# Patient Record
Sex: Female | Born: 1990 | Race: Black or African American | Hispanic: No | Marital: Single | Smoking: Current every day smoker
Health system: Southern US, Community
[De-identification: ages and names within clinical notes are randomized; demographics above are authoritative.]

## PROBLEM LIST (undated history)

## (undated) DIAGNOSIS — Z789 Other specified health status: Secondary | ICD-10-CM

## (undated) DIAGNOSIS — I10 Essential (primary) hypertension: Secondary | ICD-10-CM

## (undated) DIAGNOSIS — K819 Cholecystitis, unspecified: Secondary | ICD-10-CM

## (undated) HISTORY — PX: NO PAST SURGERIES: SHX2092

---

## 2010-11-28 ENCOUNTER — Emergency Department (HOSPITAL_BASED_OUTPATIENT_CLINIC_OR_DEPARTMENT_OTHER): Admission: EM | Admit: 2010-11-28 | Discharge: 2010-08-03 | Payer: Self-pay | Admitting: Emergency Medicine

## 2011-03-07 LAB — URINE MICROSCOPIC-ADD ON

## 2011-03-07 LAB — URINE CULTURE

## 2011-03-07 LAB — URINALYSIS, ROUTINE W REFLEX MICROSCOPIC
Bilirubin Urine: NEGATIVE
Ketones, ur: >80 mg/dL — AB
Nitrite: NEGATIVE
Specific Gravity, Urine: 1.03 (ref 1.005–1.030)
Urobilinogen, UA: 1 mg/dL (ref 0.0–1.0)
pH: 6 (ref 5.0–8.0)

## 2012-08-26 ENCOUNTER — Observation Stay (HOSPITAL_BASED_OUTPATIENT_CLINIC_OR_DEPARTMENT_OTHER)
Admission: EM | Admit: 2012-08-26 | Discharge: 2012-08-28 | Disposition: A | Discharge status: Home / Self Care | Payer: Self-pay | Attending: General Surgery | Admitting: General Surgery

## 2012-08-26 ENCOUNTER — Encounter (HOSPITAL_BASED_OUTPATIENT_CLINIC_OR_DEPARTMENT_OTHER): Payer: Self-pay | Admitting: Emergency Medicine

## 2012-08-26 ENCOUNTER — Emergency Department (HOSPITAL_BASED_OUTPATIENT_CLINIC_OR_DEPARTMENT_OTHER): Payer: Self-pay

## 2012-08-26 DIAGNOSIS — K810 Acute cholecystitis: Secondary | ICD-10-CM | POA: Diagnosis present

## 2012-08-26 DIAGNOSIS — K8010 Calculus of gallbladder with chronic cholecystitis without obstruction: Principal | ICD-10-CM | POA: Insufficient documentation

## 2012-08-26 DIAGNOSIS — K819 Cholecystitis, unspecified: Secondary | ICD-10-CM

## 2012-08-26 HISTORY — DX: Other specified health status: Z78.9

## 2012-08-26 HISTORY — DX: Cholecystitis, unspecified: K81.9

## 2012-08-26 LAB — CBC WITH DIFFERENTIAL/PLATELET
Basophils Absolute: 0 10*3/uL (ref 0.0–0.1)
HCT: 38.6 % (ref 36.0–46.0)
Hemoglobin: 12.5 g/dL (ref 12.0–15.0)
Lymphocytes Relative: 12 % (ref 12–46)
Lymphs Abs: 1.6 10*3/uL (ref 0.7–4.0)
MCV: 77.2 fL — ABNORMAL LOW (ref 78.0–100.0)
Monocytes Absolute: 0.4 10*3/uL (ref 0.1–1.0)
Monocytes Relative: 3 % (ref 3–12)
Neutro Abs: 11.8 10*3/uL — ABNORMAL HIGH (ref 1.7–7.7)
RBC: 5 MIL/uL (ref 3.87–5.11)
RDW: 14.4 % (ref 11.5–15.5)
WBC: 13.8 10*3/uL — ABNORMAL HIGH (ref 4.0–10.5)

## 2012-08-26 LAB — COMPREHENSIVE METABOLIC PANEL
Albumin: 4 g/dL (ref 3.5–5.2)
Alkaline Phosphatase: 69 U/L (ref 39–117)
BUN: 9 mg/dL (ref 6–23)
CO2: 21 mEq/L (ref 19–32)
Chloride: 103 mEq/L (ref 96–112)
GFR calc non Af Amer: >90 mL/min (ref >90)
Potassium: 4.4 mEq/L (ref 3.5–5.1)
Total Bilirubin: 0.2 mg/dL — ABNORMAL LOW (ref 0.3–1.2)

## 2012-08-26 LAB — URINALYSIS, ROUTINE W REFLEX MICROSCOPIC
Bilirubin Urine: NEGATIVE
Glucose, UA: NEGATIVE mg/dL
Specific Gravity, Urine: 1.026 (ref 1.005–1.030)
pH: 7.5 (ref 5.0–8.0)

## 2012-08-26 LAB — PREGNANCY, URINE: Preg Test, Ur: NEGATIVE

## 2012-08-26 LAB — URINE MICROSCOPIC-ADD ON

## 2012-08-26 MED ORDER — HYDROMORPHONE HCL PF 1 MG/ML IJ SOLN
1.0000 mg | Freq: Once | INTRAMUSCULAR | Status: AC
  Administered 2012-08-26: 1 mg via INTRAVENOUS
  Filled 2012-08-26 (×2): qty 1

## 2012-08-26 MED ORDER — ACETAMINOPHEN 325 MG PO TABS: 650.0000 mg | ORAL_TABLET | Freq: Four times a day (QID) | ORAL | Status: DC | PRN

## 2012-08-26 MED ORDER — DOCUSATE SODIUM 100 MG PO CAPS
100.0000 mg | ORAL_CAPSULE | Freq: Two times a day (BID) | ORAL | Status: DC
  Administered 2012-08-27 – 2012-08-28 (×3): 100 mg via ORAL
  Filled 2012-08-26 (×5): qty 1

## 2012-08-26 MED ORDER — MORPHINE SULFATE 2 MG/ML IJ SOLN
2.0000 mg | INTRAMUSCULAR | Status: DC | PRN
  Administered 2012-08-27 (×4): 2 mg via INTRAVENOUS
  Filled 2012-08-26 (×4): qty 1

## 2012-08-26 MED ORDER — DIPHENHYDRAMINE HCL 12.5 MG/5ML PO ELIX: 12.5000 mg | ORAL_SOLUTION | Freq: Four times a day (QID) | ORAL | Status: DC | PRN

## 2012-08-26 MED ORDER — OXYCODONE HCL 5 MG PO TABS: 5.0000 mg | ORAL_TABLET | ORAL | Status: DC | PRN

## 2012-08-26 MED ORDER — DIPHENHYDRAMINE HCL 50 MG/ML IJ SOLN: 12.5000 mg | Freq: Four times a day (QID) | INTRAMUSCULAR | Status: DC | PRN

## 2012-08-26 MED ORDER — ONDANSETRON HCL 4 MG/2ML IJ SOLN: 4.0000 mg | Freq: Three times a day (TID) | INTRAMUSCULAR | Status: DC | PRN

## 2012-08-26 MED ORDER — DEXTROSE 5 % IV SOLN
2.0000 g | Freq: Four times a day (QID) | INTRAVENOUS | Status: DC
  Administered 2012-08-27 (×2): 2 g via INTRAVENOUS
  Filled 2012-08-26 (×3): qty 2

## 2012-08-26 MED ORDER — ONDANSETRON HCL 4 MG/2ML IJ SOLN
4.0000 mg | Freq: Four times a day (QID) | INTRAMUSCULAR | Status: DC | PRN
  Administered 2012-08-27 (×2): 4 mg via INTRAVENOUS
  Filled 2012-08-26 (×2): qty 2

## 2012-08-26 MED ORDER — ONDANSETRON HCL 4 MG/2ML IJ SOLN
4.0000 mg | Freq: Once | INTRAMUSCULAR | Status: AC
  Administered 2012-08-26: 4 mg via INTRAVENOUS

## 2012-08-26 MED ORDER — ACETAMINOPHEN 650 MG RE SUPP: 650.0000 mg | Freq: Four times a day (QID) | RECTAL | Status: DC | PRN

## 2012-08-26 MED ORDER — ONDANSETRON HCL 4 MG/2ML IJ SOLN
INTRAMUSCULAR | Status: AC
  Administered 2012-08-26: 4 mg via INTRAVENOUS
  Filled 2012-08-26: qty 2

## 2012-08-26 MED ORDER — HEPARIN SODIUM (PORCINE) 5000 UNIT/ML IJ SOLN
5000.0000 [IU] | Freq: Three times a day (TID) | INTRAMUSCULAR | Status: DC
  Administered 2012-08-27: 5000 [IU] via SUBCUTANEOUS
  Filled 2012-08-26 (×5): qty 1

## 2012-08-26 MED ORDER — DEXTROSE-NACL 5-0.45 % IV SOLN
INTRAVENOUS | Status: AC
  Administered 2012-08-26: 22:00:00 via INTRAVENOUS

## 2012-08-26 MED ORDER — MORPHINE SULFATE 4 MG/ML IJ SOLN
4.0000 mg | Freq: Once | INTRAMUSCULAR | Status: AC
  Administered 2012-08-26: 4 mg via INTRAVENOUS
  Filled 2012-08-26: qty 1

## 2012-08-26 MED ORDER — SODIUM CHLORIDE 0.9 % IV SOLN
INTRAVENOUS | Status: DC
  Administered 2012-08-27: via INTRAVENOUS

## 2012-08-26 MED ORDER — SODIUM CHLORIDE 0.9 % IV BOLUS (SEPSIS)
1000.0000 mL | Freq: Once | INTRAVENOUS | Status: AC
  Administered 2012-08-26: 1000 mL via INTRAVENOUS

## 2012-08-26 MED ORDER — ONDANSETRON HCL 4 MG/2ML IJ SOLN
4.0000 mg | Freq: Once | INTRAMUSCULAR | Status: AC
  Administered 2012-08-26: 4 mg via INTRAVENOUS
  Filled 2012-08-26 (×2): qty 2

## 2012-08-26 NOTE — ED Notes (Signed)
Onset 11am   abd with vomiting

## 2012-08-27 ENCOUNTER — Observation Stay (HOSPITAL_COMMUNITY): Payer: Self-pay

## 2012-08-27 ENCOUNTER — Encounter (HOSPITAL_COMMUNITY)
Admission: EM | Disposition: A | Discharge status: Home / Self Care | Payer: Self-pay | Source: Home / Self Care | Attending: Emergency Medicine

## 2012-08-27 ENCOUNTER — Encounter (HOSPITAL_COMMUNITY): Payer: Self-pay

## 2012-08-27 ENCOUNTER — Encounter (HOSPITAL_COMMUNITY): Payer: Self-pay | Admitting: Anesthesiology

## 2012-08-27 ENCOUNTER — Observation Stay (HOSPITAL_COMMUNITY): Payer: Self-pay | Admitting: Anesthesiology

## 2012-08-27 DIAGNOSIS — K8010 Calculus of gallbladder with chronic cholecystitis without obstruction: Secondary | ICD-10-CM

## 2012-08-27 HISTORY — PX: CHOLECYSTECTOMY: SHX55

## 2012-08-27 LAB — BASIC METABOLIC PANEL
BUN: 7 mg/dL (ref 6–23)
Chloride: 104 mEq/L (ref 96–112)
Creatinine, Ser: 0.52 mg/dL (ref 0.50–1.10)
GFR calc Af Amer: >90 mL/min (ref >90)
GFR calc non Af Amer: >90 mL/min (ref >90)
Glucose, Bld: 114 mg/dL — ABNORMAL HIGH (ref 70–99)

## 2012-08-27 LAB — CBC
HCT: 34.1 % — ABNORMAL LOW (ref 36.0–46.0)
Hemoglobin: 10.9 g/dL — ABNORMAL LOW (ref 12.0–15.0)
MCH: 24.8 pg — ABNORMAL LOW (ref 26.0–34.0)
MCHC: 32 g/dL (ref 30.0–36.0)
RDW: 14.1 % (ref 11.5–15.5)

## 2012-08-27 SURGERY — LAPAROSCOPIC CHOLECYSTECTOMY
Anesthesia: General | Site: Abdomen | Wound class: Clean Contaminated

## 2012-08-27 MED ORDER — NEOSTIGMINE METHYLSULFATE 1 MG/ML IJ SOLN
INTRAMUSCULAR | Status: DC | PRN
  Administered 2012-08-27: 4 mg via INTRAVENOUS

## 2012-08-27 MED ORDER — HEPARIN SODIUM (PORCINE) 5000 UNIT/ML IJ SOLN
5000.0000 [IU] | Freq: Three times a day (TID) | INTRAMUSCULAR | Status: DC
  Administered 2012-08-27 – 2012-08-28 (×2): 5000 [IU] via SUBCUTANEOUS
  Filled 2012-08-27 (×5): qty 1

## 2012-08-27 MED ORDER — BUPIVACAINE-EPINEPHRINE 0.25% -1:200000 IJ SOLN
INTRAMUSCULAR | Status: AC
  Filled 2012-08-27: qty 1

## 2012-08-27 MED ORDER — VITAMINS A & D EX OINT
TOPICAL_OINTMENT | CUTANEOUS | Status: AC
  Filled 2012-08-27: qty 5

## 2012-08-27 MED ORDER — LACTATED RINGERS IR SOLN
Status: DC | PRN
  Administered 2012-08-27: 2000 mL

## 2012-08-27 MED ORDER — HYDROMORPHONE HCL PF 1 MG/ML IJ SOLN: 0.2500 mg | INTRAMUSCULAR | Status: DC | PRN

## 2012-08-27 MED ORDER — LACTATED RINGERS IV SOLN
INTRAVENOUS | Status: DC | PRN
  Administered 2012-08-27: 09:00:00 via INTRAVENOUS

## 2012-08-27 MED ORDER — DIATRIZOATE MEGLUMINE 30 % UR SOLN
URETHRAL | Status: DC | PRN
  Administered 2012-08-27: 300 mL

## 2012-08-27 MED ORDER — SODIUM CHLORIDE 0.9 % IV SOLN
INTRAVENOUS | Status: DC
  Administered 2012-08-27: 12:00:00 via INTRAVENOUS

## 2012-08-27 MED ORDER — PROPOFOL 10 MG/ML IV BOLUS
INTRAVENOUS | Status: DC | PRN
  Administered 2012-08-27: 200 mg via INTRAVENOUS

## 2012-08-27 MED ORDER — BUPIVACAINE-EPINEPHRINE 0.25% -1:200000 IJ SOLN
INTRAMUSCULAR | Status: DC | PRN
  Administered 2012-08-27: 16 mL

## 2012-08-27 MED ORDER — ROCURONIUM BROMIDE 100 MG/10ML IV SOLN
INTRAVENOUS | Status: DC | PRN
  Administered 2012-08-27: 5 mg via INTRAVENOUS
  Administered 2012-08-27: 30 mg via INTRAVENOUS

## 2012-08-27 MED ORDER — ONDANSETRON HCL 4 MG/2ML IJ SOLN
INTRAMUSCULAR | Status: DC | PRN
  Administered 2012-08-27: 4 mg via INTRAVENOUS

## 2012-08-27 MED ORDER — SUCCINYLCHOLINE CHLORIDE 20 MG/ML IJ SOLN
INTRAMUSCULAR | Status: DC | PRN
  Administered 2012-08-27: 100 mg via INTRAVENOUS

## 2012-08-27 MED ORDER — FENTANYL CITRATE 0.05 MG/ML IJ SOLN
INTRAMUSCULAR | Status: DC | PRN
  Administered 2012-08-27: 100 ug via INTRAVENOUS
  Administered 2012-08-27 (×3): 50 ug via INTRAVENOUS

## 2012-08-27 MED ORDER — KETOROLAC TROMETHAMINE 30 MG/ML IJ SOLN: 15.0000 mg | Freq: Once | INTRAMUSCULAR | Status: DC | PRN

## 2012-08-27 MED ORDER — PROMETHAZINE HCL 25 MG/ML IJ SOLN
12.5000 mg | INTRAMUSCULAR | Status: DC | PRN
  Administered 2012-08-27 (×2): 25 mg via INTRAVENOUS
  Filled 2012-08-27 (×2): qty 1

## 2012-08-27 MED ORDER — DEXAMETHASONE SODIUM PHOSPHATE 10 MG/ML IJ SOLN
INTRAMUSCULAR | Status: DC | PRN
  Administered 2012-08-27: 10 mg via INTRAVENOUS

## 2012-08-27 MED ORDER — LACTATED RINGERS IV SOLN: INTRAVENOUS | Status: DC | PRN

## 2012-08-27 MED ORDER — PROMETHAZINE HCL 25 MG/ML IJ SOLN: 6.2500 mg | INTRAMUSCULAR | Status: DC | PRN

## 2012-08-27 MED ORDER — HEMOSTATIC AGENTS (NO CHARGE) OPTIME
TOPICAL | Status: DC | PRN
  Administered 2012-08-27: 1 via TOPICAL

## 2012-08-27 MED ORDER — GLYCOPYRROLATE 0.2 MG/ML IJ SOLN
INTRAMUSCULAR | Status: DC | PRN
  Administered 2012-08-27: 0.6 mg via INTRAVENOUS

## 2012-08-27 SURGICAL SUPPLY — 41 items
APPLIER CLIP 5 13 M/L LIGAMAX5 (MISCELLANEOUS) ×3
APPLIER CLIP ROT 10 11.4 M/L (STAPLE)
BANDAGE ADHESIVE 1X3 (GAUZE/BANDAGES/DRESSINGS) ×9 IMPLANT
BENZOIN TINCTURE PRP APPL 2/3 (GAUZE/BANDAGES/DRESSINGS) ×3 IMPLANT
CANISTER SUCTION 2500CC (MISCELLANEOUS) ×3 IMPLANT
CHLORAPREP W/TINT 26ML (MISCELLANEOUS) ×3 IMPLANT
CLIP APPLIE 5 13 M/L LIGAMAX5 (MISCELLANEOUS) ×2 IMPLANT
CLIP APPLIE ROT 10 11.4 M/L (STAPLE) IMPLANT
CLOTH BEACON ORANGE TIMEOUT ST (SAFETY) ×3 IMPLANT
COVER MAYO STAND STRL (DRAPES) ×3 IMPLANT
COVER SURGICAL LIGHT HANDLE (MISCELLANEOUS) ×3 IMPLANT
DECANTER SPIKE VIAL GLASS SM (MISCELLANEOUS) ×3 IMPLANT
DRAPE C-ARM 42X72 X-RAY (DRAPES) ×3 IMPLANT
DRAPE LAPAROSCOPIC ABDOMINAL (DRAPES) ×3 IMPLANT
DRAPE UTILITY XL STRL (DRAPES) ×3 IMPLANT
DRSG TEGADERM 2-3/8X2-3/4 SM (GAUZE/BANDAGES/DRESSINGS) ×3 IMPLANT
ELECT REM PT RETURN 9FT ADLT (ELECTROSURGICAL) ×3
ELECTRODE REM PT RTRN 9FT ADLT (ELECTROSURGICAL) ×2 IMPLANT
GLOVE BIO SURGEON STRL SZ7.5 (GLOVE) ×3 IMPLANT
GLOVE BIOGEL M STRL SZ7.5 (GLOVE) ×6 IMPLANT
GLOVE BIOGEL PI IND STRL 7.0 (GLOVE) ×2 IMPLANT
GLOVE BIOGEL PI INDICATOR 7.0 (GLOVE) ×1
GLOVE INDICATOR 8.0 STRL GRN (GLOVE) ×3 IMPLANT
GOWN PREVENTION PLUS XXLARGE (GOWN DISPOSABLE) ×3 IMPLANT
GOWN STRL NON-REIN LRG LVL3 (GOWN DISPOSABLE) ×3 IMPLANT
GOWN STRL REIN XL XLG (GOWN DISPOSABLE) ×6 IMPLANT
HEMOSTAT SNOW SURGICEL 2X4 (HEMOSTASIS) ×3 IMPLANT
KIT BASIN OR (CUSTOM PROCEDURE TRAY) ×3 IMPLANT
NS IRRIG 1000ML POUR BTL (IV SOLUTION) ×3 IMPLANT
POUCH SPECIMEN RETRIEVAL 10MM (ENDOMECHANICALS) ×3 IMPLANT
SET CHOLANGIOGRAPH MIX (MISCELLANEOUS) ×3 IMPLANT
SET IRRIG TUBING LAPAROSCOPIC (IRRIGATION / IRRIGATOR) ×3 IMPLANT
SOLUTION ANTI FOG 6CC (MISCELLANEOUS) ×3 IMPLANT
STRIP CLOSURE SKIN 1/2X4 (GAUZE/BANDAGES/DRESSINGS) ×3 IMPLANT
SUT MNCRL AB 4-0 PS2 18 (SUTURE) ×3 IMPLANT
SUT VICRYL 0 UR6 27IN ABS (SUTURE) ×3 IMPLANT
TOWEL OR 17X26 10 PK STRL BLUE (TOWEL DISPOSABLE) ×3 IMPLANT
TRAY LAP CHOLE (CUSTOM PROCEDURE TRAY) ×3 IMPLANT
TROCAR BLADELESS OPT 5 75 (ENDOMECHANICALS) ×9 IMPLANT
TROCAR XCEL BLUNT TIP 100MML (ENDOMECHANICALS) ×3 IMPLANT
TUBING INSUFFLATION 10FT LAP (TUBING) ×3 IMPLANT

## 2012-08-27 NOTE — Progress Notes (Signed)
Pt returned to room from PACU post Lap. Choley., Lethargic but arousable. Abdominal dsg. x4, 2 sites soiled. Will continue to monitor.

## 2012-08-27 NOTE — Preoperative (Signed)
Beta Blockers   Reason not to administer Beta Blockers:Not Applicable 

## 2012-08-27 NOTE — ED Provider Notes (Signed)
History     CSN: [id number]  Arrival date & time 08/26/12  1738   First MD Initiated Contact with Patient 08/26/12 1806      Chief Complaint  Patient presents with  . Abdominal Pain    (Consider location/radiation/quality/duration/timing/severity/associated sxs/prior treatment) HPI Patient is a 21 year old female who presents today complaining of 10 out of 10 epigastric and right upper quadrant abdominal pain with associated nausea and vomiting. Patient noted that this began today upon waking at 10:30 AM. She at [facility] prior to going to sleep last night. Patient has not been able to tolerate by mouth and his been vomiting repeatedly. She denies any fevers or sick contacts. Patient has history of prior diagnosis of cholecystitis with cholelithiasis during her last pregnancy. At that time patient merely had surgery while she was [redacted] weeks pregnant but after treatment with antibiotic this was able to be avoided. Plan was for patient to have surgery following birth of her child but her symptoms had been dormant until today. She has no history of abdominal surgery. Patient denies radiation of her pain. She describes it as sharp. Patient denies any urinary symptoms, vaginal discharge, her concern that she may be pregnant. There are no other associated or modifying factors. Past Medical History  Diagnosis Date  . Cholecystitis     No past surgical history on file.  No family history on file.  History  Substance Use Topics  . Smoking status: Current Everyday Smoker  . Smokeless tobacco: Not on file  . Alcohol Use: Yes    OB History    Grav Para Term Preterm Abortions TAB SAB Ect Mult Living                  Review of Systems  Constitutional: Positive for appetite change.  HENT: Negative.   Eyes: Negative.   Respiratory: Negative.   Cardiovascular: Negative.   Gastrointestinal: Positive for nausea, vomiting and abdominal pain.  Genitourinary: Negative.   Musculoskeletal:  Negative.   Skin: Negative.   Neurological: Negative.   Hematological: Negative.   Psychiatric/Behavioral: Negative.   All other systems reviewed and are negative.    Allergies  Review of patient's allergies indicates no known allergies.  Home Medications  No current outpatient prescriptions on file.  BP 126/80  Pulse 58  Temp 97.7 F (36.5 C) (Oral)  Resp 18  Ht 5\' 9"  (1.753 m)  Wt 274 lb 4 oz (124.4 kg)  BMI 40.50 kg/m2  SpO2 99%  Physical Exam  Nursing note and vitals reviewed. GEN: Well-developed, well-nourished female in moderate distress, vomiting HEENT: Atraumatic, normocephalic. Oropharynx clear without erythema EYES: PERRLA BL, no scleral icterus. NECK: Trachea midline, no meningismus CV: regular rate and rhythm. No murmurs, rubs, or gallops PULM: No respiratory distress.  No crackles, wheezes, or rales. GI: soft, positive Murphy's sign,. Right upper quadrant and epigastric tenderness to palpation. No peritoneal signs. + bowel sounds  GU: deferred Neuro: cranial nerves grossly 2-12 intact, no abnormalities of strength or sensation, A and O x 3 MSK: Patient moves all 4 extremities symmetrically, no deformity, edema, or injury noted Skin: No rashes petechiae, purpura, or jaundice Psych: no abnormality of mood   ED Course  Procedures (including critical care time)  Labs Reviewed  URINALYSIS, ROUTINE W REFLEX MICROSCOPIC - Abnormal; Notable for the following:    Hgb urine dipstick SMALL (*)     All other components within normal limits  CBC WITH DIFFERENTIAL - Abnormal; Notable for the following:  WBC 13.8 (*)     MCV 77.2 (*)     MCH 25.0 (*)     Neutrophils Relative 85 (*)     Neutro Abs 11.8 (*)     All other components within normal limits  COMPREHENSIVE METABOLIC PANEL - Abnormal; Notable for the following:    Glucose, Bld 107 (*)     Total Bilirubin 0.2 (*)     All other components within normal limits  PREGNANCY, URINE  LIPASE, BLOOD  URINE  MICROSCOPIC-ADD ON  BASIC METABOLIC PANEL  CBC   US Abdomen Complete  08/26/2012  *RADIOLOGY REPORT*  Clinical Data:  Epigastric and right upper quadrant abdominal pain.  COMPLETE ABDOMINAL ULTRASOUND  Comparison:  None.  Findings:  Gallbladder:  Shadowing gallstones at the neck of gallbladder measure up to 18 mm.  The gallstones are mobile.  There is no wall thickening or edematous change.  The maximal wall thickness is 2.2 mm, within normal limits.  There is a positive sonographic Murphy's sign.  Common bile duct:  Normal in caliber. No biliary ductal dilation. The maximal diameter is 2.7 mm.  Liver:  No focal lesion identified.  Within normal limits in parenchymal echogenicity.  IVC:  Limited visualization due to overlying bowel gas.  Pancreas:  Limited visualization due to overlying bowel gas.  Spleen:  Normal size and echotexture without focal parenchymal abnormality.  The maximal length is 6.0 cm, within normal limits.  Right Kidney:  No hydronephrosis.  Well-preserved cortex.  Normal size and parenchymal echotexture without focal abnormalities. The maximal length is 11.0 cm, within normal limits.  Left Kidney:  No hydronephrosis.  Well-preserved cortex.  Normal size and parenchymal echotexture without focal abnormalities. The maximal length is 0.7 cm, within normal limits.  Abdominal aorta:  No aneurysm identified.  IMPRESSION:  1.  Multiple gallstones with a positive sonographic Murphy's sign, raising concern for early cholecystitis.  There is no significant wall thickening. 2.  This poor visualization of the IVC, pancreas, and aorta secondary to overlying bowel gas.   Original Report Authenticated By: Jamesetta Orleans. MATTERN, M.D.      1. Cholecystitis       MDM  Patient was evaluated by myself. Based upon history and presentation patient had workup for her abdominal pain performed. Urine pregnancy was negative and laboratory workup was remarkable for a leukocytosis of 13.8 with no  abnormalities of liver function are lipase. Patient was given IV fluids as well as Zofran and morphine. This improved her symptoms but did not resolve them. Patient abdominal ultrasound with positive sonographic Murphy's sign as well as numerous gallstones in the neck of the gallbladder measuring up to 18 mm. Maximal gallbladder wall thickness is 2.2 mm with common bile duct being normal in caliber. Patient remained afebrile. Immediately following exam patient began vomiting again. She was unable to tolerate by mouth. I discussed the patient with on-call surgeon, Dr. Maisie Fus. She exhibited the patient for direct admit to [facility]. Patient was transferred in good condition.        Cyndra Numbers, MD 08/27/12 [phone]

## 2012-08-27 NOTE — H&P (Signed)
Chief Complaint  Patient presents with  . Abdominal Pain    HISTORY: April Morrison is a 21 y.o. female who presented to [facility] last night with generalized abd pain since 11 am that morning.  This was accompanied by intractable nausea and vomiting.  Upon arrival to the ED, lab work revealed a slightly elevated wbc but no elevated lft's or signs of pancreatitis.  An [place] was performed which was read as early cholecystitis.  She states low grade fevers at home.  No diarrhea or constipation.    Past Medical History  Diagnosis Date  . Cholecystitis   . No pertinent past medical history     Past Surgical History  Procedure Date  . No past surgeries     Current Facility-Administered Medications  Medication Dose Route Frequency Provider Last Rate Last Dose  . 0.9 %  sodium chloride infusion   Intravenous Continuous Romie Levee, MD 100 mL/hr at 08/27/12 0015    . acetaminophen (TYLENOL) tablet 650 mg  650 mg Oral Q6H PRN Romie Levee, MD       Or  . acetaminophen (TYLENOL) suppository 650 mg  650 mg Rectal Q6H PRN Romie Levee, MD      . cefOXitin (MEFOXIN) 2 g in dextrose 5 % 50 mL IVPB  2 g Intravenous Q6H Romie Levee, MD   2 g at 08/27/12 0609  . dextrose 5 %-0.45 % sodium chloride infusion   Intravenous STAT Meagan Hunt, MD      . diphenhydrAMINE (BENADRYL) injection 12.5-25 mg  12.5-25 mg Intravenous Q6H PRN Romie Levee, MD       Or  . diphenhydrAMINE (BENADRYL) 12.5 MG/5ML elixir 12.5-25 mg  12.5-25 mg Oral Q6H PRN Romie Levee, MD      . docusate sodium (COLACE) capsule 100 mg  100 mg Oral BID Romie Levee, MD   100 mg at 08/27/12 0009  . heparin injection 5,000 Units  5,000 Units Subcutaneous Q8H Romie Levee, MD   5,000 Units at 08/27/12 0009  . HYDROmorphone (DILAUDID) injection 1 mg  1 mg Intravenous Once Cyndra Numbers, MD   1 mg at 08/26/12 2051  . morphine 2 MG/ML injection 2 mg  2 mg Intravenous Q2H PRN Romie Levee, MD   2 mg at 08/27/12 0609  . morphine 4  MG/ML injection 4 mg  4 mg Intravenous Once Cyndra Numbers, MD   4 mg at 08/26/12 1824  . ondansetron (ZOFRAN) injection 4 mg  4 mg Intravenous Once Cyndra Numbers, MD   4 mg at 08/26/12 1819  . ondansetron (ZOFRAN) injection 4 mg  4 mg Intravenous Once Cyndra Numbers, MD   4 mg at 08/26/12 2049  . ondansetron (ZOFRAN) injection 4 mg  4 mg Intravenous Q6H PRN Romie Levee, MD   4 mg at 08/27/12 0353  . oxyCODONE (Oxy IR/ROXICODONE) immediate release tablet 5 mg  5 mg Oral Q4H PRN Romie Levee, MD      . promethazine (PHENERGAN) injection 12.5-25 mg  12.5-25 mg Intravenous Q4H PRN Romie Levee, MD   25 mg at 08/27/12 0609  . sodium chloride 0.9 % bolus 1,000 mL  1,000 mL Intravenous Once Cyndra Numbers, MD   1,000 mL at 08/26/12 1822  . vitamin A & D ointment           . DISCONTD: ondansetron (ZOFRAN) injection 4 mg  4 mg Intravenous Q8H PRN Cyndra Numbers, MD         No Known Allergies   History  reviewed. No pertinent family history.   History   Social History  . Marital Status: Single    Spouse Name: N/A    Number of Children: N/A  . Years of Education: N/A   Social History Main Topics  . Smoking status: Current Everyday Smoker  . Smokeless tobacco: None  . Alcohol Use: Yes  . Drug Use: No  . Sexually Active:    Other Topics Concern  . None   Social History Narrative  . None     REVIEW OF SYSTEMS - PERTINENT POSITIVES ONLY: 12 point review of systems negative other than HPI and PMH  EXAM: Filed Vitals:   08/27/12 0528  BP: 123/75  Pulse: 54  Temp: 98.1 F (36.7 C)  Resp: 18    Gen:  No acute distress.  Well nourished and well groomed.   Neurological: Alert and oriented to person, place, and time. Coordination normal.  Head: Normocephalic and atraumatic.  Eyes: Conjunctivae are normal. Pupils are equal, round, and reactive to light. No scleral icterus.  Neck: Normal range of motion. Neck supple. No tracheal deviation or thyromegaly present.  No cervical  lymphadenopathy. Cardiovascular: Normal rate, regular rhythm, normal heart sounds and intact distal pulses.  Respiratory: Effort normal.  No respiratory distress.  Breath sounds normal.  No wheezes, rales or rhonchi.  GI: Soft. Bowel sounds are normal. The abdomen is soft and tender to palpation in RUQ.   Musculoskeletal: Normal range of motion. Extremities are nontender.  Skin: Skin is warm and dry. No rash noted. No diaphoresis. No erythema. No pallor. No clubbing, cyanosis, or edema.   Psychiatric: Normal mood and affect. Behavior is normal. Judgment and thought content normal.    LABORATORY RESULTS: Lab Results  Component Value Date   WBC 14.6* 08/27/2012   HGB 10.9* 08/27/2012   HCT 34.1* 08/27/2012   MCV 77.5* 08/27/2012   PLT 262 08/27/2012   Lab Results  Component Value Date   ALT 11 08/26/2012   AST 17 08/26/2012   ALKPHOS 69 08/26/2012   BILITOT 0.2* 08/26/2012   Lab Results  Component Value Date   LIPASE 18 08/26/2012   Sodium 137  Potassium 3.7  Chloride 104  CO2 23  Glucose Creatinine, Ser 0.52  Calcium 8.6   RADIOLOGY RESULTS:   Images and reports are reviewed. RUQ US  Gallbladder: Shadowing gallstones at the neck of gallbladder measure up to 18 mm. The gallstones are mobile. There is no wall thickening or edematous change. The maximal wall thickness is 2.2 mm, within normal limits. There is a positive sonographic Murphy's sign. IMPRESSION:  1. Multiple gallstones with a positive sonographic Murphy's sign, raising concern for early cholecystitis. There is no significant wall thickening.  ASSESSMENT AND PLAN:  Acute cholecystitis with no signs of pancreatitis or CBD obstruction IV Abx- Cefoxitin  OR today for l/s cholecystectomy   Vanita Panda, MD Colon and Rectal Surgery / General Surgery [facility]      Visit Diagnoses: 1. Cholecystitis     Primary Care Physician: No primary provider on file.

## 2012-08-27 NOTE — Transfer of Care (Signed)
Immediate Anesthesia Transfer of Care Note  Patient: April Morrison  Procedure(s) Performed: Procedure(s) (LRB) with comments: LAPAROSCOPIC CHOLECYSTECTOMY () - attempted cholangiogram  Patient Location: PACU  Anesthesia Type: General  Level of Consciousness: awake, alert  and patient cooperative  Airway & Oxygen Therapy: Patient Spontanous Breathing and Patient connected to face mask oxygen  Post-op Assessment: Report given to PACU RN and Post -op Vital signs reviewed and stable  Post vital signs: Reviewed and stable  Complications: No apparent anesthesia complications

## 2012-08-27 NOTE — Anesthesia Postprocedure Evaluation (Signed)
  Anesthesia Post-op Note  Patient: April Morrison  Procedure(s) Performed: Procedure(s) (LRB): LAPAROSCOPIC CHOLECYSTECTOMY ()  Patient Location: PACU  Anesthesia Type: General  Level of Consciousness: awake and alert   Airway and Oxygen Therapy: Patient Spontanous Breathing  Post-op Pain: mild  Post-op Assessment: Post-op Vital signs reviewed, Patient's Cardiovascular Status Stable, Respiratory Function Stable, Patent Airway and No signs of Nausea or vomiting  Post-op Vital Signs: stable  Complications: No apparent anesthesia complications

## 2012-08-27 NOTE — Op Note (Signed)
The patient was identified & brought into the operating room. The patient was positioned supine with arms out to the side. SCDs were active during the entire case. The patient underwent general anesthesia without any difficulty.  The abdomen was prepped and draped in a sterile fashion. A Surgical Timeout confirmed our plan.  We positioned the patient in reverse [place] & right side up. A 10mm hassan laparoscopic port was placed through the abdominal wall using a cut down technique.  Entry was clean.  We induced carbon dioxide insufflation. Camera inspection revealed no injury.  There were no adhesions to the anterior abdominal wall supraumbilically.  I proceeded to continue with laparoscopic technique. I placed a #5 port in sub-xyphoid position under direct visulaization, 2 other 5mm ports in the right upper quadrant, also under direct visualization.  I turned attention to the right upper quadrant.  The gallbladder was tense and therefore aspirated with a needle aspirator.  The gallbladder fundus was elevated cephalad. I used electrocautery to free the peritoneal coverings between the gallbladder and the liver on the posteriolateral and anteriomedial walls.   I used careful blunt and dissection to help get a good critical view of the cystic artery and cystic duct. I did further dissection to free a few centimeters of the  gallbladder off the liver bed to get a good critical view of the infundibulum and cystic duct. I mobilized the cystic artery; and, after getting a good 360 view, ligated the cystic artery using clips. I skeletonized the cystic duct.  I placed a clip on the infundibulum. I did a partial cystic duct-otomy and ensured patency. I placed a 5 [place] cholangiocatheter through a puncture site at the right subcostal ridge of the abdominal wall and directed it into the cystic duct.  I was unable to run a cholangiogramdue to crimping of the catheter. 2 attempts were made before I decided to  abort the procedure.  The was a good critical view of the cystic duct.  I could also visualize the common bile duct underneath.     I removed the cholangiocatheter. I placed clips on the cystic duct x3.   I completed cystic duct transection. I freed the gallbladder from its remaining attachments to the liver. I ensured hemostasis on the gallbladder fossa of the liver and elsewhere using electrocautery. I inspected the rest of the abdomen & detected no injury nor bleeding elsewhere.  I removed the gallbladder through the umbilical port site.  I closed the umbilical fascia transversely using 0 Vicryl interrupted stitches and a laparoscopic suture passer.  The umbilical incision was irrigated.  The skin of all incisions was close using 4-0 monocryl stitches.  Sterile dressings were applied. The patient was extubated & arrived in the PACU in stable condition.  I had discussed postoperative care with the patient in the holding area.   I am about to locate the patient's family and discuss operative findings and postoperative goals / instructions.  Instructions are written in the chart as well.

## 2012-08-27 NOTE — Progress Notes (Signed)
The anatomy & physiology of hepatobiliary & pancreatic function was discussed.  The pathophysiology of gallbladder dysfunction was discussed.  Natural history risks without surgery was discussed.   I feel the risks of no intervention will lead to serious problems that outweigh the operative risks; therefore, I recommended cholecystectomy to remove the pathology.  I explained laparoscopic techniques with possible need for an open approach.  Probable cholangiogram to evaluate the bilary tract was explained as well.    Risks such as bleeding, infection, abscess, leak, injury to other organs, need for further treatment, heart attack, death, and other risks were discussed.  I noted a good likelihood this will help address the problem.  Possibility that this will not correct all abdominal symptoms was explained.  Goals of post-operative recovery were discussed as well.  We will work to minimize complications. Questions were answered.  The patient expresses understanding & wishes to proceed with surgery.

## 2012-08-27 NOTE — Anesthesia Preprocedure Evaluation (Signed)
Anesthesia Evaluation  Patient identified by MRN, date of birth, ID band Patient awake    Reviewed: Allergy & Precautions, H&P , NPO status , Patient's Chart, lab work & pertinent test results  Airway Mallampati: II TM Distance: <3 FB Neck ROM: Full    Dental No notable dental hx.    Pulmonary Current Smoker,    + decreased breath sounds      Cardiovascular negative cardio ROS  Rhythm:Regular Rate:Normal     Neuro/Psych negative neurological ROS  negative psych ROS   GI/Hepatic negative GI ROS, Neg liver ROS,   Endo/Other  Morbid obesity  Renal/GU negative Renal ROS  negative genitourinary   Musculoskeletal negative musculoskeletal ROS (+)   Abdominal   Peds negative pediatric ROS (+)  Hematology negative hematology ROS (+)   Anesthesia Other Findings   Reproductive/Obstetrics negative OB ROS                           Anesthesia Physical Anesthesia Plan  ASA: III  Anesthesia Plan: General   Post-op Pain Management:    Induction: Intravenous  Airway Management Planned: Oral ETT  Additional Equipment:   Intra-op Plan:   Post-operative Plan: Extubation in OR  Informed Consent: I have reviewed the patients History and Physical, chart, labs and discussed the procedure including the risks, benefits and alternatives for the proposed anesthesia with the patient or authorized representative who has indicated his/her understanding and acceptance.   Dental advisory given  Plan Discussed with: CRNA and Surgeon  Anesthesia Plan Comments:         Anesthesia Quick Evaluation

## 2012-08-27 NOTE — Brief Op Note (Signed)
08/26/2012 - 08/27/2012  11:03 AM  PATIENT:  April Morrison  21 y.o. female  PRE-OPERATIVE DIAGNOSIS:  gallstones  POST-OPERATIVE DIAGNOSIS:  gallstones  PROCEDURE:  Procedure(s) (LRB) with comments: LAPAROSCOPIC CHOLECYSTECTOMY () - attempted cholangiogram  SURGEON:  Surgeon(s) and Role:    * Romie Levee, MD - Primary    * Atilano Ina, MD,FACS - Assisting  PHYSICIAN ASSISTANT:   ASSISTANTS: Gaynelle Adu   ANESTHESIA:   local and general  EBL:  Total I/O In: 700 [I.V.:700] Out: 50 [Blood:50]  BLOOD ADMINISTERED:none  DRAINS: none   LOCAL MEDICATIONS USED:  BUPIVICAINE   SPECIMEN:  Source of Specimen:  Gallbladder  DISPOSITION OF SPECIMEN:  PATHOLOGY  COUNTS:  YES  TOURNIQUET:  * No tourniquets in log *  DICTATION: .Dragon Dictation  PLAN OF CARE: Admit for overnight observation  PATIENT DISPOSITION:  PACU - hemodynamically stable.   Delay start of Pharmacological VTE agent (>24hrs) due to surgical blood loss or risk of bleeding: no

## 2012-08-28 LAB — HEMOGLOBIN: Hemoglobin: 10.9 g/dL — ABNORMAL LOW (ref 12.0–15.0)

## 2012-08-28 MED ORDER — HYDROCODONE-ACETAMINOPHEN 5-325 MG PO TABS: 1.0000 | ORAL_TABLET | Freq: Four times a day (QID) | ORAL | Status: AC | PRN

## 2012-08-28 NOTE — Progress Notes (Signed)
Pt disharrged to home. Dc instructions given using teach back method. No concerns voiced. Prescription x 1 given for pain pills. Left unit in wheelchair pushed by nurse tech. Left in good condition.

## 2012-08-28 NOTE — Progress Notes (Signed)
1 Day Post-Op  Subjective: No complaints  Objective: Vital signs in last 24 hours: Temp:  [97.4 F (36.3 C)-98.8 F (37.1 C)] 98.1 F (36.7 C) (09/07 0548) Pulse Rate:  [68-88] 73  (09/07 0548) Resp:  [14-24] 18  (09/07 0548) BP: (106-124)/(69-79) 106/69 mmHg (09/07 0548) SpO2:  [97 %-100 %] 97 % (09/07 0548) Last BM Date: 08/27/12  Intake/Output from previous day: 09/06 0701 - 09/07 0700 In: 800 [I.V.:800] Out: 1050 [Urine:1000; Blood:50] Intake/Output this shift: Total I/O In: 1342.5 [I.V.:1342.5] Out: 1800 [Urine:1800]  GI: soft, mild tenderness  Lab Results:   Basename 08/28/12 0545 08/27/12 0517 08/26/12 1840  WBC -- 14.6* 13.8*  HGB 10.9* 10.9* --  HCT -- 34.1* 38.6  PLT -- 262 300   BMET  Basename 08/27/12 0517 08/26/12 1840  NA 137 137  K 3.7 4.4  CL 104 103  CO2 23 21  GLUCOSE 114* 107*  BUN 7 9  CREATININE 0.52 0.60  CALCIUM 8.6 9.5   PT/INR No results found for this basename: LABPROT:2,INR:2 in the last 72 hours ABG No results found for this basename: PHART:2,PCO2:2,PO2:2,HCO3:2 in the last 72 hours  Studies/Results: US Abdomen Complete  08/26/2012  *RADIOLOGY REPORT*  Clinical Data:  Epigastric and right upper quadrant abdominal pain.  COMPLETE ABDOMINAL ULTRASOUND  Comparison:  None.  Findings:  Gallbladder:  Shadowing gallstones at the neck of gallbladder measure up to 18 mm.  The gallstones are mobile.  There is no wall thickening or edematous change.  The maximal wall thickness is 2.2 mm, within normal limits.  There is a positive sonographic Murphy's sign.  Common bile duct:  Normal in caliber. No biliary ductal dilation. The maximal diameter is 2.7 mm.  Liver:  No focal lesion identified.  Within normal limits in parenchymal echogenicity.  IVC:  Limited visualization due to overlying bowel gas.  Pancreas:  Limited visualization due to overlying bowel gas.  Spleen:  Normal size and echotexture without focal parenchymal abnormality.  The maximal  length is 6.0 cm, within normal limits.  Right Kidney:  No hydronephrosis.  Well-preserved cortex.  Normal size and parenchymal echotexture without focal abnormalities. The maximal length is 11.0 cm, within normal limits.  Left Kidney:  No hydronephrosis.  Well-preserved cortex.  Normal size and parenchymal echotexture without focal abnormalities. The maximal length is 0.7 cm, within normal limits.  Abdominal aorta:  No aneurysm identified.  IMPRESSION:  1.  Multiple gallstones with a positive sonographic Murphy's sign, raising concern for early cholecystitis.  There is no significant wall thickening. 2.  This poor visualization of the IVC, pancreas, and aorta secondary to overlying bowel gas.   Original Report Authenticated By: Jamesetta Orleans. MATTERN, M.D.     Anti-infectives: Anti-infectives     Start     Dose/Rate Route Frequency Ordered Stop   08/27/12 0000   cefOXitin (MEFOXIN) 2 g in dextrose 5 % 50 mL IVPB  Status:  Discontinued        2 g 100 mL/hr over 30 Minutes Intravenous 4 times per day 08/26/12 2314 08/27/12 1157          Assessment/Plan: s/p Procedure(s) (LRB) with comments: LAPAROSCOPIC CHOLECYSTECTOMY () - attempted cholangiogram Advance diet Discharge  LOS: 2 days    TOTH III,Lindy Garczynski S 08/28/2012

## 2012-08-30 ENCOUNTER — Encounter (HOSPITAL_COMMUNITY): Payer: Self-pay | Admitting: General Surgery

## 2012-08-30 ENCOUNTER — Telehealth (INDEPENDENT_AMBULATORY_CARE_PROVIDER_SITE_OTHER): Payer: Self-pay | Admitting: General Surgery

## 2012-08-30 NOTE — Telephone Encounter (Signed)
Pt called with questions on how to remove April Morrison dressing from surgical wound.  Described how to get it off and subsequent wound care.  She states she understands and will comply.

## 2012-09-03 DIAGNOSIS — K810 Acute cholecystitis: Secondary | ICD-10-CM | POA: Diagnosis present

## 2012-09-03 NOTE — Discharge Summary (Signed)
Physician Discharge Summary  Patient ID: April Morrison MRN: [id number] DOB/AGE: 01-26-1991 20 y.o.  Admit date: 08/26/2012 Discharge date: 08/28/2012  Admission Diagnoses:  Acute cholecystitis with no signs of pancreatitis or CBD obstruction   Discharge Diagnoses: Same Active Problems:  * No active hospital problems. *    PROCEDURES: LAPAROSCOPIC CHOLECYSTECTOMY () - attempted cholangiogram, 08/27/12 Dr. Araceli Bouche Course: April Morrison is a 21 y.o. female who presented to [facility] last night with generalized abd pain since 11 am that morning. This was accompanied by intractable nausea and vomiting. Upon arrival to the ED, lab work revealed a slightly elevated wbc but no elevated lft's or signs of pancreatitis. An [place] was performed which was read as early cholecystitis. She states low grade fevers at home. No diarrhea or constipation She was admitted and taken to the OR for surgery later that day.  She did well and was discharged home the following day. 08/27/12   Disposition: 01-Home or Self Care  Discharge Orders    Future Appointments: Provider: Department: Dept Phone: Center:   09/14/2012 11:50 AM Romie Levee, MD [facility] [phone] None     Future Orders Please Complete By Expires   Diet - low sodium heart healthy      Increase activity slowly      Discharge instructions      Comments:   No heavy lifting. May shower. Diet as tolerated   No wound care      Call MD for:  temperature >100.4      Call MD for:  persistant nausea and vomiting      Call MD for:  severe uncontrolled pain      Call MD for:  redness, tenderness, or signs of infection (pain, swelling, redness, odor or green/yellow discharge around incision site)      Call MD for:  difficulty breathing, headache or visual disturbances      Call MD for:  hives      Call MD for:  persistant dizziness or light-headedness      Call MD for:  extreme fatigue          Medication List     As  of 09/03/2012  4:56 PM    TAKE these medications         HYDROcodone-acetaminophen 5-325 MG per tablet   Commonly known as: NORCO/VICODIN   Take 1-2 tablets by mouth every 6 (six) hours as needed for pain.      ibuprofen 200 MG tablet   Commonly known as: ADVIL,MOTRIN   Take 200 mg by mouth every 6 (six) hours as needed. For pain.           Follow-up Information    Follow up with Vanita Panda., MD. Schedule an appointment as soon as possible for a visit in 2 weeks.   Contact information:   [address], [address] [place] [address] [phone]          Signed: Sherrie George 09/03/2012, 4:56 PM

## 2012-09-04 NOTE — Discharge Summary (Signed)
ATTENDING ADDENDUM:  I personally reviewed patient's record and agree with the above plan

## 2012-09-14 ENCOUNTER — Encounter (INDEPENDENT_AMBULATORY_CARE_PROVIDER_SITE_OTHER): Payer: Self-pay | Admitting: General Surgery

## 2012-09-16 ENCOUNTER — Ambulatory Visit (INDEPENDENT_AMBULATORY_CARE_PROVIDER_SITE_OTHER): Payer: Self-pay | Admitting: General Surgery

## 2012-09-16 ENCOUNTER — Encounter (INDEPENDENT_AMBULATORY_CARE_PROVIDER_SITE_OTHER): Payer: Self-pay | Admitting: General Surgery

## 2012-09-16 VITALS — BP 115/66 | HR 80 | Temp 98.6°F | Resp 18 | Ht 69.0 in | Wt 267.0 lb

## 2012-09-16 DIAGNOSIS — K819 Cholecystitis, unspecified: Secondary | ICD-10-CM

## 2012-09-16 NOTE — Patient Instructions (Signed)
No heavy lifting for 2 more weeks.  Ok to return to all other normal activities and work.

## 2012-09-16 NOTE — Progress Notes (Signed)
Kimi Kadrmas is a 21 y.o. female who is status post a laparoscopic cholecystectomy on 9/5.  Since surgery she is doing well.  She is having minimal pain.  She denies any nausea, vomiting or diarrhea.    Objective: There were no vitals filed for this visit.  General appearance: alert, cooperative and no distress GI: normal findings: soft, non-tender  Incision: healing well   Assessment: s/p  Patient Active Problem List  Diagnosis  . Acute cholecystitis    Pathology Diagnosis Gallbladder - CHRONIC CHOLECYSTITIS AND CHOLELITHIASIS. - ONE BENIGN LYMPH NODE, NEGATIVE FOR NEOPLASM.  Plan: Ok to return to work RTC PRN   .Vanita Panda, MD [facility], [place] [phone]   09/16/2012 4:22 PM

## 2013-09-08 ENCOUNTER — Emergency Department (HOSPITAL_BASED_OUTPATIENT_CLINIC_OR_DEPARTMENT_OTHER)
Admission: EM | Admit: 2013-09-08 | Discharge: 2013-09-08 | Disposition: A | Discharge status: Home / Self Care | Payer: Self-pay | Attending: Emergency Medicine | Admitting: Emergency Medicine

## 2013-09-08 ENCOUNTER — Encounter (HOSPITAL_BASED_OUTPATIENT_CLINIC_OR_DEPARTMENT_OTHER): Payer: Self-pay | Admitting: Emergency Medicine

## 2013-09-08 DIAGNOSIS — Z8719 Personal history of other diseases of the digestive system: Secondary | ICD-10-CM | POA: Insufficient documentation

## 2013-09-08 DIAGNOSIS — R059 Cough, unspecified: Secondary | ICD-10-CM | POA: Insufficient documentation

## 2013-09-08 DIAGNOSIS — Z3202 Encounter for pregnancy test, result negative: Secondary | ICD-10-CM | POA: Insufficient documentation

## 2013-09-08 DIAGNOSIS — B9689 Other specified bacterial agents as the cause of diseases classified elsewhere: Secondary | ICD-10-CM

## 2013-09-08 DIAGNOSIS — F17200 Nicotine dependence, unspecified, uncomplicated: Secondary | ICD-10-CM | POA: Insufficient documentation

## 2013-09-08 DIAGNOSIS — Z202 Contact with and (suspected) exposure to infections with a predominantly sexual mode of transmission: Secondary | ICD-10-CM

## 2013-09-08 DIAGNOSIS — J45909 Unspecified asthma, uncomplicated: Secondary | ICD-10-CM | POA: Insufficient documentation

## 2013-09-08 DIAGNOSIS — N760 Acute vaginitis: Secondary | ICD-10-CM | POA: Insufficient documentation

## 2013-09-08 DIAGNOSIS — R05 Cough: Secondary | ICD-10-CM | POA: Insufficient documentation

## 2013-09-08 LAB — URINALYSIS, ROUTINE W REFLEX MICROSCOPIC
Nitrite: NEGATIVE
Protein, ur: NEGATIVE mg/dL
Specific Gravity, Urine: 1.035 — ABNORMAL HIGH (ref 1.005–1.030)
Urobilinogen, UA: 1 mg/dL (ref 0.0–1.0)

## 2013-09-08 LAB — HIV ANTIBODY (ROUTINE TESTING W REFLEX): HIV: NONREACTIVE

## 2013-09-08 LAB — URINE MICROSCOPIC-ADD ON

## 2013-09-08 LAB — WET PREP, GENITAL: Yeast Wet Prep HPF POC: NONE SEEN

## 2013-09-08 LAB — PREGNANCY, URINE: Preg Test, Ur: NEGATIVE

## 2013-09-08 LAB — RPR: RPR Ser Ql: NONREACTIVE

## 2013-09-08 MED ORDER — CEFTRIAXONE SODIUM 250 MG IJ SOLR
250.0000 mg | Freq: Once | INTRAMUSCULAR | Status: AC
  Administered 2013-09-08: 250 mg via INTRAMUSCULAR
  Filled 2013-09-08 (×2): qty 250

## 2013-09-08 MED ORDER — AZITHROMYCIN 250 MG PO TABS
1000.0000 mg | ORAL_TABLET | Freq: Once | ORAL | Status: AC
  Administered 2013-09-08: 1000 mg via ORAL
  Filled 2013-09-08 (×2): qty 4

## 2013-09-08 MED ORDER — LIDOCAINE HCL (PF) 1 % IJ SOLN
INTRAMUSCULAR | Status: AC
  Administered 2013-09-08: 1.2 mL
  Filled 2013-09-08: qty 5

## 2013-09-08 MED ORDER — LIDOCAINE HCL (PF) 1 % IJ SOLN
INTRAMUSCULAR | Status: AC
  Filled 2013-09-08: qty 5

## 2013-09-08 MED ORDER — METRONIDAZOLE 500 MG PO TABS: 500.0000 mg | ORAL_TABLET | Freq: Two times a day (BID) | ORAL | Status: DC

## 2013-09-08 NOTE — ED Provider Notes (Addendum)
CSN: [id number]     Arrival date & time 09/08/13  8295 History   First MD Initiated Contact with Patient 09/08/13 1013     Chief Complaint  Patient presents with  . Exposure to STD   (Consider location/radiation/quality/duration/timing/severity/associated sxs/prior Treatment) Patient is a 22 y.o. female presenting with STD exposure. The history is provided by the patient.  Exposure to STD This is a new problem. The current episode started yesterday. The problem occurs constantly. The problem has not changed since onset.Pertinent negatives include no shortness of breath. Associated symptoms comments: Maybe some mild worsening of normal vaginal d/c. Nothing aggravates the symptoms. Nothing relieves the symptoms. She has tried nothing for the symptoms. The treatment provided no relief.    Past Medical History  Diagnosis Date  . Cholecystitis   . No pertinent past medical history   . Asthma    Past Surgical History  Procedure Laterality Date  . No past surgeries    . Cholecystectomy  08/27/2012    Procedure: LAPAROSCOPIC CHOLECYSTECTOMY;  Surgeon: Romie Levee, MD;  Location: [facility];  Service: General;;  attempted cholangiogram   History reviewed. No pertinent family history. History  Substance Use Topics  . Smoking status: Current Every Day Smoker  . Smokeless tobacco: Not on file  . Alcohol Use: Yes   OB History   Grav Para Term Preterm Abortions TAB SAB Ect Mult Living                 Review of Systems  Constitutional: Negative for fever and chills.  Respiratory: Positive for cough. Negative for shortness of breath.        Recent URI with cough and congestion  Gastrointestinal: Negative for nausea, vomiting and diarrhea.  Genitourinary: Positive for vaginal bleeding and vaginal discharge. Negative for dysuria and vaginal pain.  All other systems reviewed and are negative.    Allergies  Review of patient's allergies indicates no known allergies.  Home Medications  No  current outpatient prescriptions on file. BP 138/97  Pulse 108  Temp(Src) 99.2 F (37.3 C) (Oral)  Resp 18  Ht 5\' 9"  (1.753 m)  Wt 272 lb (123.378 kg)  BMI 40.15 kg/m2  SpO2 100%  LMP 08/06/2013 Physical Exam  Nursing note and vitals reviewed. Constitutional: She is oriented to person, place, and time. She appears well-developed and well-nourished. No distress.  HENT:  Head: Normocephalic and atraumatic.  Eyes: EOM are normal. Pupils are equal, round, and reactive to light.  Cardiovascular: Normal rate, regular rhythm, normal heart sounds and intact distal pulses.  Exam reveals no friction rub.   No murmur heard. Pulmonary/Chest: Effort normal and breath sounds normal. She has no wheezes. She has no rales.  Abdominal: Soft. Bowel sounds are normal. She exhibits no distension. There is no tenderness. There is no rebound and no guarding.  Genitourinary: Uterus normal. Cervix exhibits no motion tenderness and no discharge. Right adnexum displays no mass, no tenderness and no fullness. Left adnexum displays no mass, no tenderness and no fullness. There is bleeding around the vagina. Vaginal discharge found.  Musculoskeletal: Normal range of motion. She exhibits no tenderness.  No edema  Neurological: She is alert and oriented to person, place, and time. No cranial nerve deficit.  Skin: Skin is warm and dry. No rash noted.  Psychiatric: She has a normal mood and affect. Her behavior is normal.    ED Course  Procedures (including critical care time) Labs Review Labs Reviewed  WET PREP, GENITAL - Abnormal;  Notable for the following:    Clue Cells Wet Prep HPF POC MODERATE (*)    WBC, Wet Prep HPF POC FEW (*)    All other components within normal limits  URINALYSIS, ROUTINE W REFLEX MICROSCOPIC - Abnormal; Notable for the following:    Color, Urine AMBER (*)    APPearance CLOUDY (*)    Specific Gravity, Urine 1.035 (*)    Hgb urine dipstick LARGE (*)    Bilirubin Urine SMALL (*)     Ketones, ur 15 (*)    Leukocytes, UA SMALL (*)    All other components within normal limits  URINE MICROSCOPIC-ADD ON - Abnormal; Notable for the following:    Squamous Epithelial / LPF FEW (*)    Bacteria, UA MANY (*)    All other components within normal limits  GC/CHLAMYDIA PROBE AMP  URINE CULTURE  PREGNANCY, URINE  RPR  HIV ANTIBODY (ROUTINE TESTING)   Imaging Review No results found.  MDM   1. Exposure to STD   2. Bacterial vaginosis     Patient is here because her partner told her that he had Trichomonas. She has noticed increased vaginal discharge but does not know for how long. She denies any abdominal pain, dysuria but states her period has been irregular this month.  No significant findings on exam. No cervical motion tenderness. Only mild discharge and spotting.  STD panel done. Urine pregnancy test is negative.  11:08 AM Negative for trichomonas. Wet prep shows moderate clue cells and a few white blood cells. UA is contaminated the patient has no urinary symptoms such this time we'll not pursue treatment for UTI. Patient will be given Rocephin, azithromycin and Flagyl. UPTs negative  Gwyneth Sprout, MD 09/08/13 1109  Gwyneth Sprout, MD 09/08/13 1119

## 2013-09-08 NOTE — ED Notes (Signed)
Pt states her partner has trichomonas and is requesting treatment.

## 2013-09-09 LAB — URINE CULTURE: Culture: NO GROWTH

## 2013-10-27 ENCOUNTER — Other Ambulatory Visit: Payer: Self-pay

## 2014-03-23 IMAGING — US US ABDOMEN COMPLETE
1 series · 13 of 25 positions shown · non-contrast
Comparison: None.

CLINICAL DATA: Epigastric and right upper quadrant abdominal pain.

COMPLETE ABDOMINAL ULTRASOUND

[Series 1: us abdomen complete · 0.32mm/px · 13 of 74 slices shown]
[im 1/74]
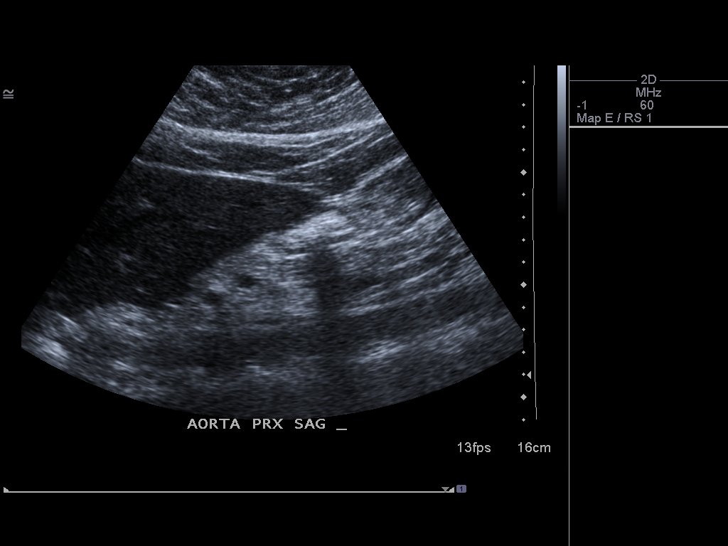
[im 7/74]
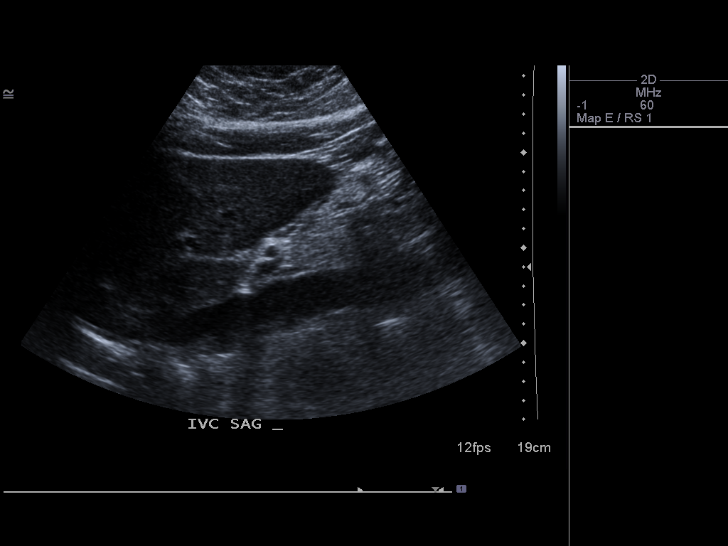
[im 13/74]
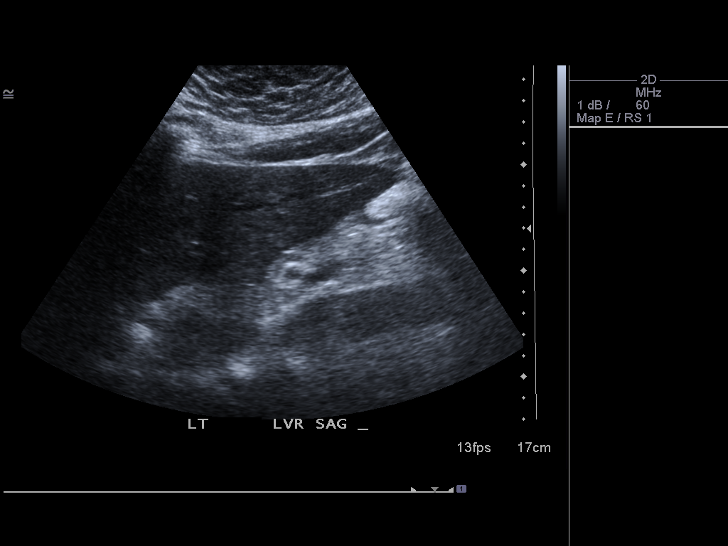
[im 19/74]
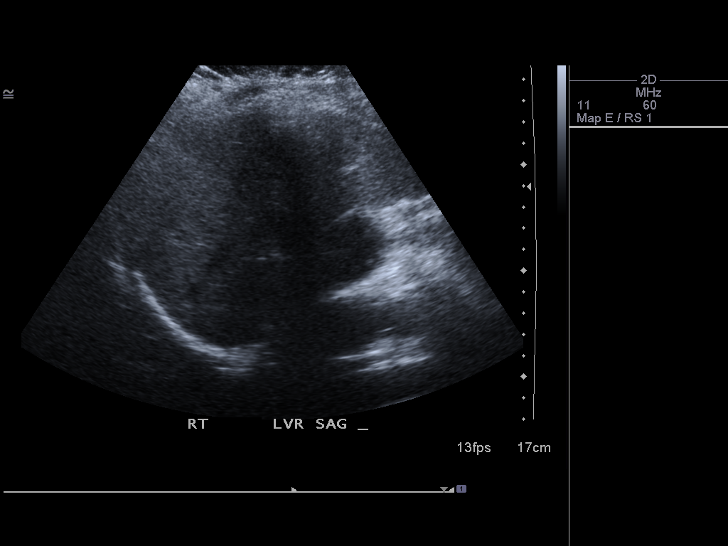
[im 25/74]
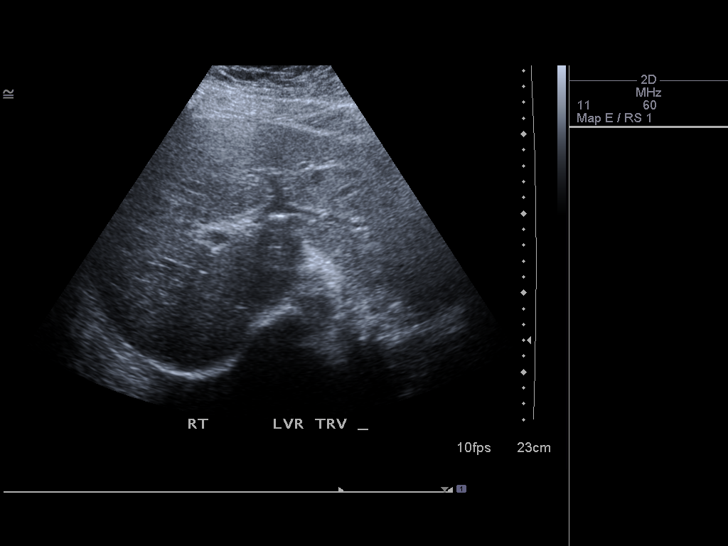
[im 31/74]
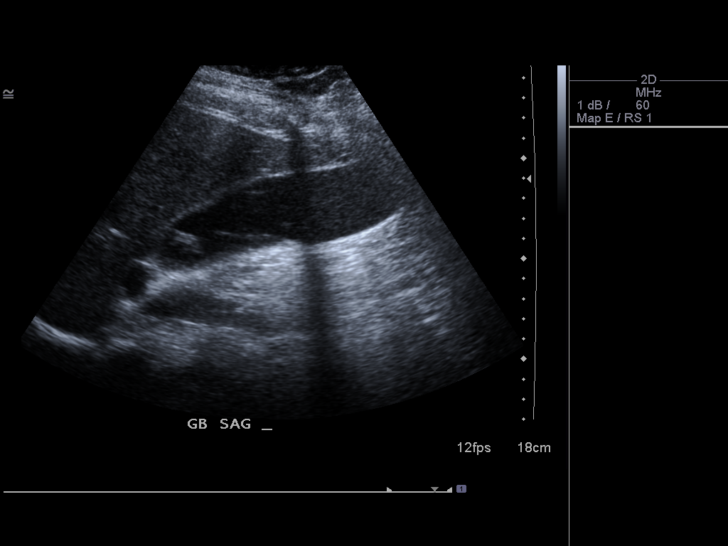
[im 37/74]
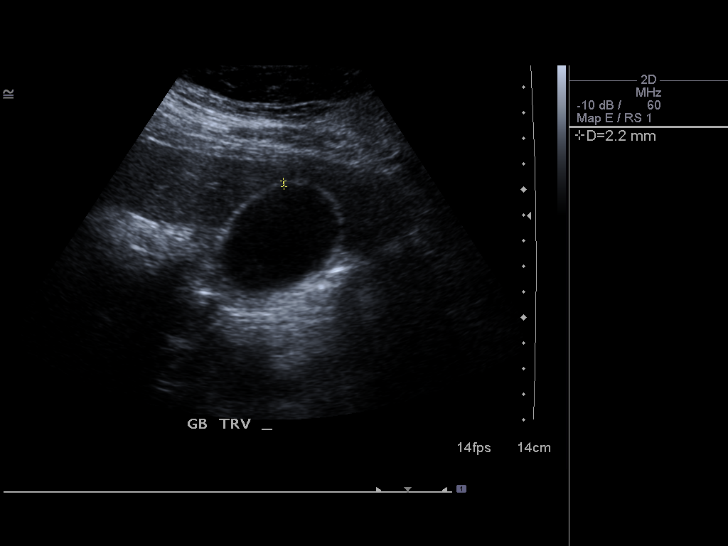
[im 43/74]
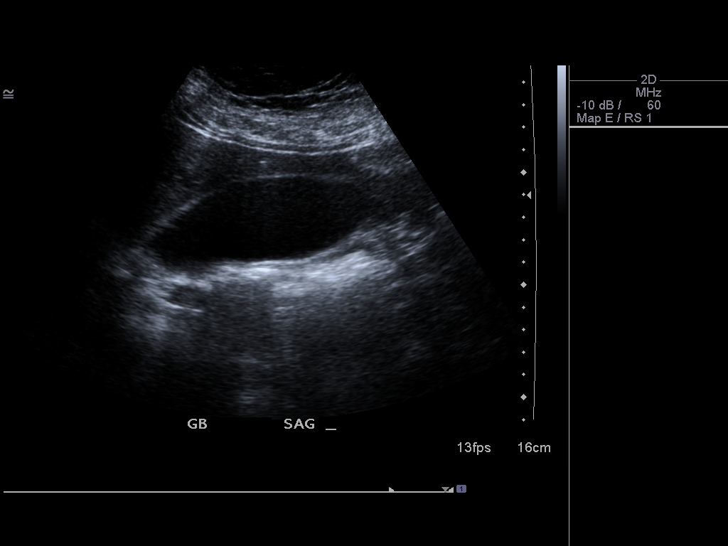
[im 49/74]
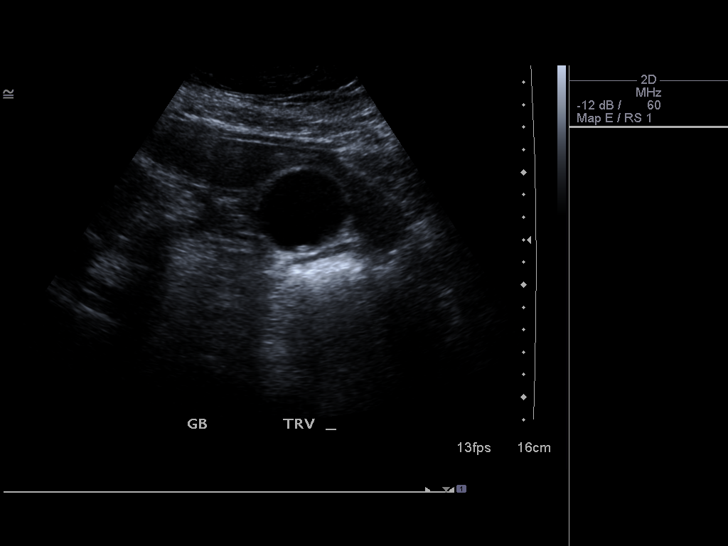
[im 55/74]
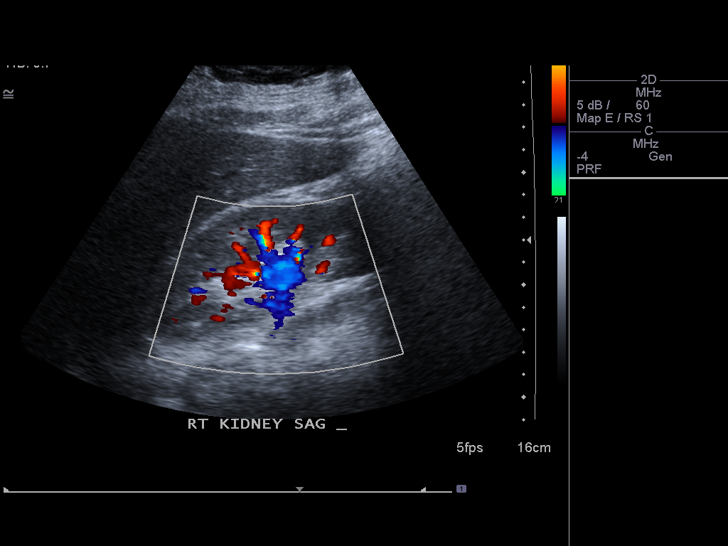
[im 61/74]
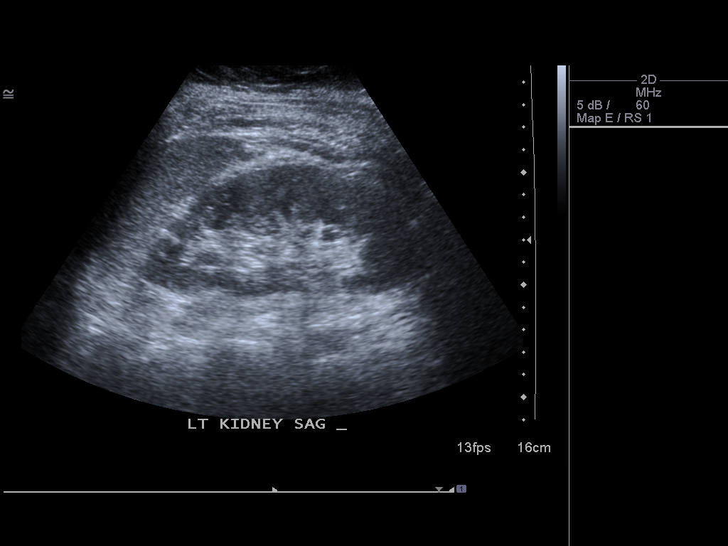
[im 67/74]
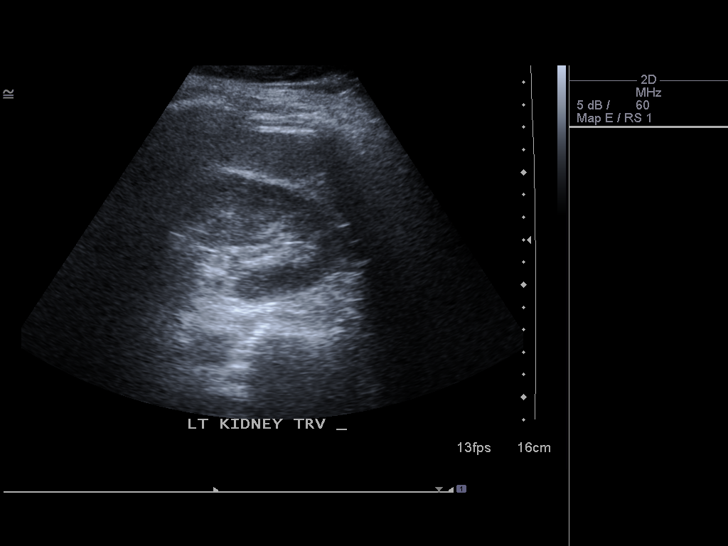
[im 74/74]
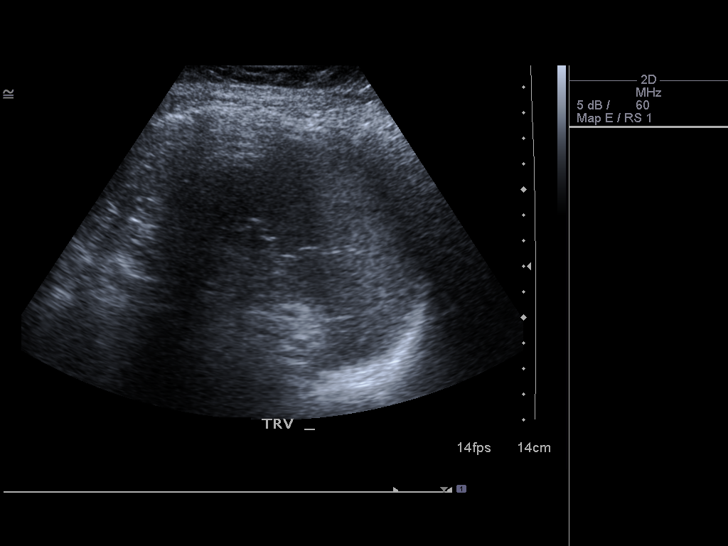

[13 of 25 positions shown; findings below may reference images not displayed]

FINDINGS: Gallbladder:  Shadowing gallstones at the neck of gallbladder
measure up to 18 mm.  The gallstones are mobile.  There is no wall
thickening or edematous change.  The maximal wall thickness is
mm, within normal limits.  There is a positive sonographic Murphy's
sign.

Common bile duct:  Normal in caliber. No biliary ductal dilation.
The maximal diameter is 2.7 mm.

Liver:  No focal lesion identified.  Within normal limits in
parenchymal echogenicity.

IVC:  Limited visualization due to overlying bowel gas.

Pancreas:  Limited visualization due to overlying bowel gas.

Spleen:  Normal size and echotexture without focal parenchymal
abnormality.  The maximal length is 6.0 cm, within normal limits.

Right Kidney:  No hydronephrosis.  Well-preserved cortex.  Normal
size and parenchymal echotexture without focal abnormalities. The
maximal length is 11.0 cm, within normal limits.

Left Kidney:  No hydronephrosis.  Well-preserved cortex.  Normal
size and parenchymal echotexture without focal abnormalities. The
maximal length is 0.7 cm, within normal limits.

Abdominal aorta:  No aneurysm identified.
IMPRESSION: 1.  Multiple gallstones with a positive sonographic Murphy's sign,
raising concern for early cholecystitis.  There is no significant
wall thickening.
2.  This poor visualization of the IVC, pancreas, and aorta
secondary to overlying bowel gas.

## 2015-06-17 ENCOUNTER — Encounter (HOSPITAL_BASED_OUTPATIENT_CLINIC_OR_DEPARTMENT_OTHER): Payer: Self-pay | Admitting: *Deleted

## 2015-06-17 ENCOUNTER — Ambulatory Visit (HOSPITAL_BASED_OUTPATIENT_CLINIC_OR_DEPARTMENT_OTHER)
Admission: RE | Admit: 2015-06-17 | Discharge: 2015-06-17 | Disposition: A | Discharge status: Home / Self Care | Payer: Self-pay | Source: Ambulatory Visit | Attending: Emergency Medicine | Admitting: Emergency Medicine

## 2015-06-17 ENCOUNTER — Other Ambulatory Visit (HOSPITAL_BASED_OUTPATIENT_CLINIC_OR_DEPARTMENT_OTHER): Payer: Self-pay | Admitting: Emergency Medicine

## 2015-06-17 ENCOUNTER — Emergency Department (HOSPITAL_BASED_OUTPATIENT_CLINIC_OR_DEPARTMENT_OTHER)
Admission: EM | Admit: 2015-06-17 | Discharge: 2015-06-17 | Disposition: A | Discharge status: Home / Self Care | Payer: Self-pay | Attending: Emergency Medicine | Admitting: Emergency Medicine

## 2015-06-17 DIAGNOSIS — Z8719 Personal history of other diseases of the digestive system: Secondary | ICD-10-CM | POA: Insufficient documentation

## 2015-06-17 DIAGNOSIS — M79605 Pain in left leg: Secondary | ICD-10-CM

## 2015-06-17 DIAGNOSIS — Z792 Long term (current) use of antibiotics: Secondary | ICD-10-CM | POA: Insufficient documentation

## 2015-06-17 DIAGNOSIS — Z720 Tobacco use: Secondary | ICD-10-CM | POA: Insufficient documentation

## 2015-06-17 DIAGNOSIS — M79662 Pain in left lower leg: Secondary | ICD-10-CM | POA: Insufficient documentation

## 2015-06-17 DIAGNOSIS — J45909 Unspecified asthma, uncomplicated: Secondary | ICD-10-CM | POA: Insufficient documentation

## 2015-06-17 DIAGNOSIS — M7981 Nontraumatic hematoma of soft tissue: Secondary | ICD-10-CM | POA: Insufficient documentation

## 2015-06-17 DIAGNOSIS — T148XXA Other injury of unspecified body region, initial encounter: Secondary | ICD-10-CM

## 2015-06-17 MED ORDER — IBUPROFEN 800 MG PO TABS
800.0000 mg | ORAL_TABLET | Freq: Once | ORAL | Status: AC
  Administered 2015-06-17: 800 mg via ORAL
  Filled 2015-06-17: qty 1

## 2015-06-17 NOTE — ED Notes (Addendum)
C/o L calf pain, bruise present, first noticed pain last week x2. Pain returned today. Pinpoints pain to L knee to L foot. No swelling or firmness noted. CMS intact. Pedal pulses palpable. Denies BCP or long distance travel. Admits to smoking. Denies CP or sob. Alert, NAD, calm, interactive, no dyspnea noted.

## 2015-06-17 NOTE — Discharge Instructions (Signed)
Contusion °A contusion is a deep bruise. Contusions happen when an injury causes bleeding under the skin. Signs of bruising include pain, puffiness (swelling), and discolored skin. The contusion may turn blue, purple, or yellow. °HOME CARE  °· Put ice on the injured area. °¨ Put ice in a plastic bag. °¨ Place a towel between your skin and the bag. °¨ Leave the ice on for 15-20 minutes, 03-04 times a day. °· Only take medicine as told by your doctor. °· Rest the injured area. °· If possible, raise (elevate) the injured area to lessen puffiness. °GET HELP RIGHT AWAY IF:  °· You have more bruising or puffiness. °· You have pain that is getting worse. °· Your puffiness or pain is not helped by medicine. °MAKE SURE YOU:  °· Understand these instructions. °· Will watch your condition. °· Will get help right away if you are not doing well or get worse. °Document Released: 05/26/2008 Document Revised: 03/01/2012 Document Reviewed: 10/13/2011 °[employer]® Patient Information ©2015 [employer]. This information is not intended to replace advice given to you by your health care provider. Make sure you discuss any questions you have with your health care provider. ° °Cryotherapy °Cryotherapy means treatment with cold. Ice or gel packs can be used to reduce both pain and swelling. Ice is the most helpful within the first 24 to 48 hours after an injury or flare-up from overusing a muscle or joint. Sprains, strains, spasms, burning pain, shooting pain, and aches can all be eased with ice. Ice can also be used when recovering from surgery. Ice is effective, has very few side effects, and is safe for most people to use. °PRECAUTIONS  °Ice is not a safe treatment option for people with: °· Raynaud phenomenon. This is a condition affecting small blood vessels in the extremities. Exposure to cold may cause your problems to return. °· Cold hypersensitivity. There are many forms of cold hypersensitivity, including: °¨ Cold urticaria.  Red, itchy hives appear on the skin when the tissues begin to warm after being iced. °¨ Cold erythema. This is a red, itchy rash caused by exposure to cold. °¨ Cold hemoglobinuria. Red blood cells break down when the tissues begin to warm after being iced. The hemoglobin that carry oxygen are passed into the urine because they cannot combine with blood proteins fast enough. °· Numbness or altered sensitivity in the area being iced. °If you have any of the following conditions, do not use ice until you have discussed cryotherapy with your caregiver: °· Heart conditions, such as arrhythmia, angina, or chronic heart disease. °· High blood pressure. °· Healing wounds or open skin in the area being iced. °· Current infections. °· Rheumatoid arthritis. °· Poor circulation. °· Diabetes. °Ice slows the blood flow in the region it is applied. This is beneficial when trying to stop inflamed tissues from spreading irritating chemicals to surrounding tissues. However, if you expose your skin to cold temperatures for too long or without the proper protection, you can damage your skin or nerves. Watch for signs of skin damage due to cold. °HOME CARE INSTRUCTIONS °Follow these tips to use ice and cold packs safely. °· Place a dry or damp towel between the ice and skin. A damp towel will cool the skin more quickly, so you may need to shorten the time that the ice is used. °· For a more rapid response, add gentle compression to the ice. °· Ice for no more than 10 to 20 minutes at a time.   The bonier the area you are icing, the less time it will take to get the benefits of ice.  Check your skin after 5 minutes to make sure there are no signs of a poor response to cold or skin damage.  Rest 20 minutes or more between uses.  Once your skin is numb, you can end your treatment. You can test numbness by very lightly touching your skin. The touch should be so light that you do not see the skin dimple from the pressure of your  fingertip. When using ice, most people will feel these normal sensations in this order: cold, burning, aching, and numbness.  Do not use ice on someone who cannot communicate their responses to pain, such as small children or people with dementia. HOW TO MAKE AN ICE PACK Ice packs are the most common way to use ice therapy. Other methods include ice massage, ice baths, and cryosprays. Muscle creams that cause a cold, tingly feeling do not offer the same benefits that ice offers and should not be used as a substitute unless recommended by your caregiver. To make an ice pack, do one of the following:  Place crushed ice or a bag of frozen vegetables in a sealable plastic bag. Squeeze out the excess air. Place this bag inside another plastic bag. Slide the bag into a pillowcase or place a damp towel between your skin and the bag.  Mix 3 parts water with 1 part rubbing alcohol. Freeze the mixture in a sealable plastic bag. When you remove the mixture from the freezer, it will be slushy. Squeeze out the excess air. Place this bag inside another plastic bag. Slide the bag into a pillowcase or place a damp towel between your skin and the bag. SEEK MEDICAL CARE IF:  You develop white spots on your skin. This may give the skin a blotchy (mottled) appearance.  Your skin turns blue or pale.  Your skin becomes waxy or hard.  Your swelling gets worse. MAKE SURE YOU:   Understand these instructions.  Will watch your condition.  Will get help right away if you are not doing well or get worse. Document Released: 08/04/2011 Document Revised: 04/24/2014 Document Reviewed: 08/04/2011 [facility] Patient Information 2015 [place], [place]. This information is not intended to replace advice given to you by your health care provider. Make sure you discuss any questions you have with your health care provider.  Contusion A contusion is a deep bruise. Contusions happen when an injury causes bleeding under the skin.  Signs of bruising include pain, puffiness (swelling), and discolored skin. The contusion may turn blue, purple, or yellow. HOME CARE   Put ice on the injured area.  Put ice in a plastic bag.  Place a towel between your skin and the bag.  Leave the ice on for 15-20 minutes, 03-04 times a day.  Only take medicine as told by your doctor.  Rest the injured area.  If possible, raise (elevate) the injured area to lessen puffiness. GET HELP RIGHT AWAY IF:   You have more bruising or puffiness.  You have pain that is getting worse.  Your puffiness or pain is not helped by medicine. MAKE SURE YOU:   Understand these instructions.  Will watch your condition.  Will get help right away if you are not doing well or get worse. Document Released: 05/26/2008 Document Revised: 03/01/2012 Document Reviewed: 10/13/2011 [facility] Patient Information 2015 [place], [place]. This information is not intended to replace advice given to you by  your health care provider. Make sure you discuss any questions you have with your health care provider. ° °

## 2015-06-17 NOTE — ED Provider Notes (Signed)
CSN: [id number]     Arrival date & time 06/17/15  0234 History   First MD Initiated Contact with Patient 06/17/15 0335     Chief Complaint  Patient presents with  . Leg Pain     (Consider location/radiation/quality/duration/timing/severity/associated sxs/prior Treatment) Patient is a 24 y.o. female presenting with leg pain. The history is provided by the patient.  Leg Pain Location:  Leg Leg location:  L lower leg Pain details:    Quality:  Aching   Radiates to:  Does not radiate   Severity:  Moderate   Onset quality:  Gradual   Timing:  Constant   Progression:  Unchanged Chronicity:  New Dislocation: no   Foreign body present:  No foreign bodies Prior injury to area:  No Relieved by:  Nothing Worsened by:  Nothing tried Ineffective treatments:  None tried Associated symptoms: no back pain and no stiffness   Associated symptoms comment:  Bruise Risk factors: no frequent fractures     Past Medical History  Diagnosis Date  . Cholecystitis   . No pertinent past medical history   . Asthma    Past Surgical History  Procedure Laterality Date  . No past surgeries    . Cholecystectomy  08/27/2012    Procedure: LAPAROSCOPIC CHOLECYSTECTOMY;  Surgeon: Romie Levee, MD;  Location: [facility];  Service: General;;  attempted cholangiogram   History reviewed. No pertinent family history. History  Substance Use Topics  . Smoking status: Current Every Day Smoker  . Smokeless tobacco: Not on file  . Alcohol Use: Yes   OB History    No data available     Review of Systems  Musculoskeletal: Negative for back pain and stiffness.  Neurological: Negative for weakness and numbness.  All other systems reviewed and are negative.     Allergies  Review of patient's allergies indicates no known allergies.  Home Medications   Prior to Admission medications   Medication Sig Start Date End Date Taking? Authorizing Provider  metroNIDAZOLE (FLAGYL) 500 MG tablet Take 1 tablet (500 mg  total) by mouth 2 (two) times daily. 09/08/13   Gwyneth Sprout, MD   BP 147/102 mmHg  Pulse 102  Temp(Src) 99.5 F (37.5 C) (Oral)  Resp 18  Ht  (1.753 m)  Wt 270 lb (122.471 kg)  BMI 39.85 kg/m2  SpO2 100%  LMP 06/10/2015 Physical Exam  Constitutional: She is oriented to person, place, and time. She appears well-developed and well-nourished.  HENT:  Head: Normocephalic and atraumatic.  Mouth/Throat: Oropharynx is clear and moist.  Eyes: Conjunctivae are normal. Pupils are equal, round, and reactive to light.  Neck: Normal range of motion. Neck supple.  Cardiovascular: Normal rate, regular rhythm and intact distal pulses.   Pulmonary/Chest: Effort normal and breath sounds normal. No respiratory distress. She has no wheezes. She has no rales.  Abdominal: Soft. Bowel sounds are normal. There is no tenderness. There is no rebound and no guarding.  Musculoskeletal: Normal range of motion. She exhibits no edema or tenderness.       Left knee: Normal.       Left ankle: Normal. Achilles tendon normal.       Left lower leg: Normal. She exhibits no tenderness, no bony tenderness, no swelling, no edema, no deformity and no laceration.  Neurological: She is alert and oriented to person, place, and time. She has normal reflexes.  Skin: Skin is warm and dry. Ecchymosis noted.     Psychiatric: She has a normal mood  and affect.    ED Course  Procedures (including critical care time) Labs Review Labs Reviewed - No data to display  Imaging Review No results found.   EKG Interpretation None      MDM   Final diagnoses:  Bruise    Ice and elevation.  Highly doubt DVT but will set up outpatient US of the LLE.      Cy Blamer, MD 06/17/15 [phone]

## 2015-07-25 ENCOUNTER — Emergency Department (HOSPITAL_BASED_OUTPATIENT_CLINIC_OR_DEPARTMENT_OTHER)
Admission: EM | Admit: 2015-07-25 | Discharge: 2015-07-25 | Disposition: A | Discharge status: Home / Self Care | Payer: Self-pay | Attending: Emergency Medicine | Admitting: Emergency Medicine

## 2015-07-25 ENCOUNTER — Encounter (HOSPITAL_BASED_OUTPATIENT_CLINIC_OR_DEPARTMENT_OTHER): Payer: Self-pay | Admitting: *Deleted

## 2015-07-25 DIAGNOSIS — Z711 Person with feared health complaint in whom no diagnosis is made: Secondary | ICD-10-CM

## 2015-07-25 DIAGNOSIS — Z8719 Personal history of other diseases of the digestive system: Secondary | ICD-10-CM | POA: Insufficient documentation

## 2015-07-25 DIAGNOSIS — Z720 Tobacco use: Secondary | ICD-10-CM | POA: Insufficient documentation

## 2015-07-25 DIAGNOSIS — Z3202 Encounter for pregnancy test, result negative: Secondary | ICD-10-CM | POA: Insufficient documentation

## 2015-07-25 DIAGNOSIS — J45909 Unspecified asthma, uncomplicated: Secondary | ICD-10-CM | POA: Insufficient documentation

## 2015-07-25 DIAGNOSIS — B9689 Other specified bacterial agents as the cause of diseases classified elsewhere: Secondary | ICD-10-CM

## 2015-07-25 DIAGNOSIS — N760 Acute vaginitis: Secondary | ICD-10-CM | POA: Insufficient documentation

## 2015-07-25 DIAGNOSIS — Z202 Contact with and (suspected) exposure to infections with a predominantly sexual mode of transmission: Secondary | ICD-10-CM | POA: Insufficient documentation

## 2015-07-25 LAB — PREGNANCY, URINE: Preg Test, Ur: NEGATIVE

## 2015-07-25 LAB — URINALYSIS, ROUTINE W REFLEX MICROSCOPIC
Glucose, UA: NEGATIVE mg/dL
Ketones, ur: 15 mg/dL — AB
Leukocytes, UA: NEGATIVE
Nitrite: NEGATIVE
Protein, ur: NEGATIVE mg/dL
Specific Gravity, Urine: 1.028 (ref 1.005–1.030)
Urobilinogen, UA: 1 mg/dL (ref 0.0–1.0)
pH: 6 (ref 5.0–8.0)

## 2015-07-25 LAB — URINE MICROSCOPIC-ADD ON

## 2015-07-25 LAB — WET PREP, GENITAL
Trich, Wet Prep: NONE SEEN
Yeast Wet Prep HPF POC: NONE SEEN

## 2015-07-25 MED ORDER — METRONIDAZOLE 500 MG PO TABS: 500.0000 mg | ORAL_TABLET | Freq: Two times a day (BID) | ORAL | Status: DC

## 2015-07-25 NOTE — ED Notes (Signed)
Pt's boyfriend was recently tested for STD symptoms. He has not received his results yet but she would like to be tested.

## 2015-07-25 NOTE — Discharge Instructions (Signed)
Take flagyl twice daily for 1 week. No sexual intercourse for 10 days until completion of antibiotic. You will be contacted with results of the STD cultures if positive only.  Bacterial Vaginosis Bacterial vaginosis is a vaginal infection that occurs when the normal balance of bacteria in the vagina is disrupted. It results from an overgrowth of certain bacteria. This is the most common vaginal infection in women of childbearing age. Treatment is important to prevent complications, especially in pregnant women, as it can cause a premature delivery. CAUSES  Bacterial vaginosis is caused by an increase in harmful bacteria that are normally present in smaller amounts in the vagina. Several different kinds of bacteria can cause bacterial vaginosis. However, the reason that the condition develops is not fully understood. RISK FACTORS Certain activities or behaviors can put you at an increased risk of developing bacterial vaginosis, including:  Having a new sex partner or multiple sex partners.  Douching.  Using an intrauterine device (IUD) for contraception. Women do not get bacterial vaginosis from toilet seats, bedding, swimming pools, or contact with objects around them. SIGNS AND SYMPTOMS  Some women with bacterial vaginosis have no signs or symptoms. Common symptoms include:  Grey vaginal discharge.  A fishlike odor with discharge, especially after sexual intercourse.  Itching or burning of the vagina and vulva.  Burning or pain with urination. DIAGNOSIS  Your health care provider will take a medical history and examine the vagina for signs of bacterial vaginosis. A sample of vaginal fluid may be taken. Your health care provider will look at this sample under a microscope to check for bacteria and abnormal cells. A vaginal pH test may also be done.  TREATMENT  Bacterial vaginosis may be treated with antibiotic medicines. These may be given in the form of a pill or a vaginal cream. A  second round of antibiotics may be prescribed if the condition comes back after treatment.  HOME CARE INSTRUCTIONS   Only take over-the-counter or prescription medicines as directed by your health care provider.  If antibiotic medicine was prescribed, take it as directed. Make sure you finish it even if you start to feel better.  Do not have sex until treatment is completed.  Tell all sexual partners that you have a vaginal infection. They should see their health care provider and be treated if they have problems, such as a mild rash or itching.  Practice safe sex by using condoms and only having one sex partner. SEEK MEDICAL CARE IF:   Your symptoms are not improving after 3 days of treatment.  You have increased discharge or pain.  You have a fever. MAKE SURE YOU:   Understand these instructions.  Will watch your condition.  Will get help right away if you are not doing well or get worse. FOR MORE INFORMATION  Centers for Disease Control and Prevention, Division of STD Prevention: [employer] American Sexual Health Association (ASHA): www.ashastd.org  Document Released: 12/08/2005 Document Revised: 09/28/2013 Document Reviewed: 07/20/2013 [facility] Patient Information 2015 [place], [place]. This information is not intended to replace advice given to you by your health care provider. Make sure you discuss any questions you have with your health care provider.

## 2015-07-25 NOTE — ED Provider Notes (Signed)
CSN: [id number]     Arrival date & time 07/25/15  2011 History   First MD Initiated Contact with Patient 07/25/15 2015     Chief Complaint  Patient presents with  . SEXUALLY TRANSMITTED DISEASE     (Consider location/radiation/quality/duration/timing/severity/associated sxs/prior Treatment) HPI Comments: 24 year old female requesting STD testing. Her boyfriend was tested yesterday and she is concerned that she may have an STD. He has not yet received his results. This is a new sexual partner and she does not use protection. Denies vaginal discharge, bleeding, abdominal pain, nausea, vomiting, fever or chills. When she urinated here in the emergency department, she states she felt like there is more urine to come out but she cannot get it out. Denies history of STD.  The history is provided by the patient.    Past Medical History  Diagnosis Date  . Cholecystitis   . No pertinent past medical history   . Asthma    Past Surgical History  Procedure Laterality Date  . No past surgeries    . Cholecystectomy  08/27/2012    Procedure: LAPAROSCOPIC CHOLECYSTECTOMY;  Surgeon: Romie Levee, MD;  Location: [facility];  Service: General;;  attempted cholangiogram   No family history on file. History  Substance Use Topics  . Smoking status: Current Every Day Smoker  . Smokeless tobacco: Not on file  . Alcohol Use: Yes   OB History    No data available     Review of Systems  10 Systems reviewed and are negative for acute change except as noted in the HPI.  Allergies  Review of patient's allergies indicates no known allergies.  Home Medications   Prior to Admission medications   Medication Sig Start Date End Date Taking? Authorizing Provider  metroNIDAZOLE (FLAGYL) 500 MG tablet Take 1 tablet (500 mg total) by mouth 2 (two) times daily. One po bid x 7 days 07/25/15   Nada Boozer Donie Lemelin, PA-C   BP 132/84 mmHg  Pulse 102  Temp(Src) 99.2 F (37.3 C) (Oral)  Resp 18  Ht 5\' 9"  (1.753 m)  Wt 278  lb (126.1 kg)  BMI 41.03 kg/m2  SpO2 97%  LMP 06/11/2015 Physical Exam  Constitutional: She is oriented to person, place, and time. She appears well-developed and well-nourished. No distress.  HENT:  Head: Normocephalic and atraumatic.  Mouth/Throat: Oropharynx is clear and moist.  Eyes: Conjunctivae and EOM are normal.  Neck: Normal range of motion. Neck supple.  Cardiovascular: Normal rate, regular rhythm and normal heart sounds.   Pulmonary/Chest: Effort normal and breath sounds normal. No respiratory distress.  Abdominal: Soft. Bowel sounds are normal. There is no tenderness.  Genitourinary: Uterus is not tender. Cervix exhibits no motion tenderness, no discharge and no friability. Right adnexum displays no tenderness. Left adnexum displays no tenderness. No erythema, tenderness or bleeding in the vagina. Vaginal discharge (white, malodorous) found.  Musculoskeletal: Normal range of motion. She exhibits no edema.  Neurological: She is alert and oriented to person, place, and time. No sensory deficit.  Skin: Skin is warm and dry.  Psychiatric: She has a normal mood and affect. Her behavior is normal.  Nursing note and vitals reviewed.   ED Course  Procedures (including critical care time) Labs Review Labs Reviewed  WET PREP, GENITAL - Abnormal; Notable for the following:    Clue Cells Wet Prep HPF POC MODERATE (*)    WBC, Wet Prep HPF POC MODERATE (*)    All other components within normal limits  URINALYSIS, ROUTINE  W REFLEX MICROSCOPIC (NOT AT [facility]) - Abnormal; Notable for the following:    APPearance CLOUDY (*)    Hgb urine dipstick LARGE (*)    Bilirubin Urine SMALL (*)    Ketones, ur 15 (*)    All other components within normal limits  URINE MICROSCOPIC-ADD ON - Abnormal; Notable for the following:    Squamous Epithelial / LPF MANY (*)    All other components within normal limits  PREGNANCY, URINE  HIV ANTIBODY (ROUTINE TESTING)  RPR  GC/CHLAMYDIA PROBE AMP ([facility]) NOT AT [facility]    Imaging Review No results found.   EKG Interpretation None      MDM   Final diagnoses:  BV (bacterial vaginosis)  Concern about STD in female without diagnosis   Non-toxic appearing, NAD. Wet prep significant for moderate clue cells. No CMT or adnexal tenderness concerning for PID. GC/chlamydia/HIV/RPR cultures pending. Patient does not want treatment today for GC/chlamydia would prefer to wait for the results. Infection care/precautions discussed. Safe sexual practices discussed. Stable for discharge. Return precautions given. Patient states understanding of treatment care plan and is agreeable.  Kathrynn Speed, PA-C 07/25/15 2107  Raeford Razor, MD 07/25/15 2116

## 2015-07-26 LAB — GC/CHLAMYDIA PROBE AMP (~~LOC~~) NOT AT ARMC
Chlamydia: NEGATIVE
NEISSERIA GONORRHEA: NEGATIVE

## 2015-07-27 LAB — RPR: RPR Ser Ql: NONREACTIVE

## 2015-07-27 LAB — HIV ANTIBODY (ROUTINE TESTING W REFLEX): HIV SCREEN 4TH GENERATION: NONREACTIVE

## 2016-01-26 IMAGING — US US EXTREM LOW VENOUS*L*
1 series · 13 of 24 positions shown · non-contrast
Comparison: None.

CLINICAL DATA: Left posterior calf pain 1 week.  No injury.



[Series 1: us extrem low venous*left* · 0.08mm/px · 13 of 36 slices shown]
[im 1/36]
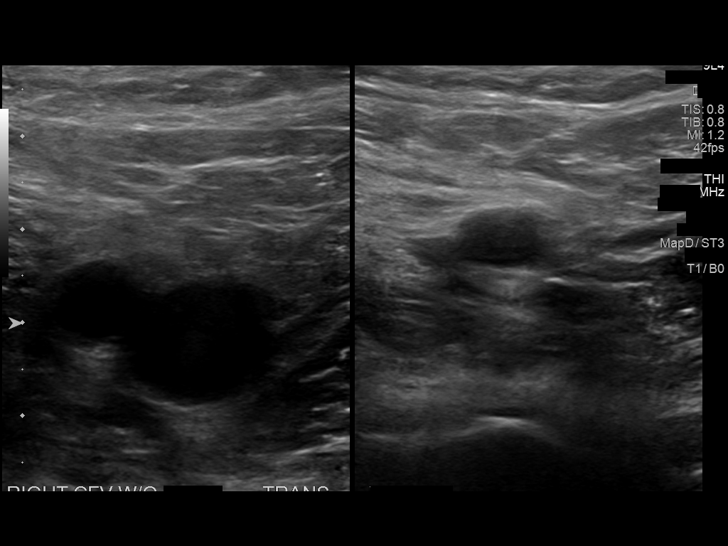
[im 4/36]
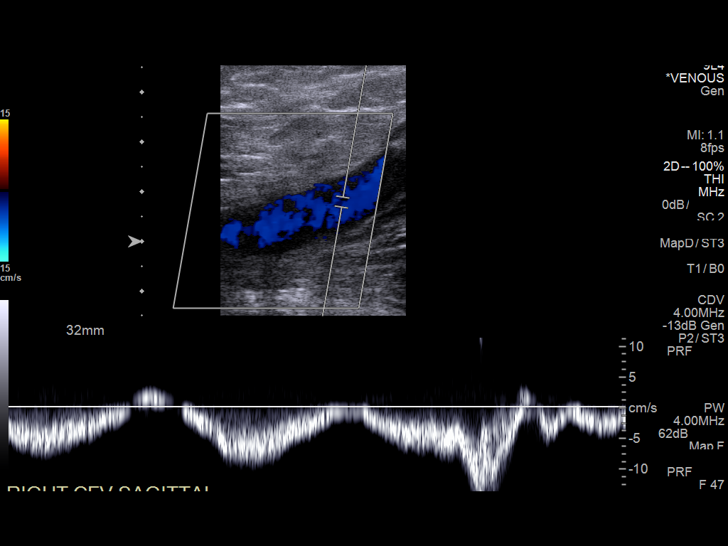
[im 7/36]
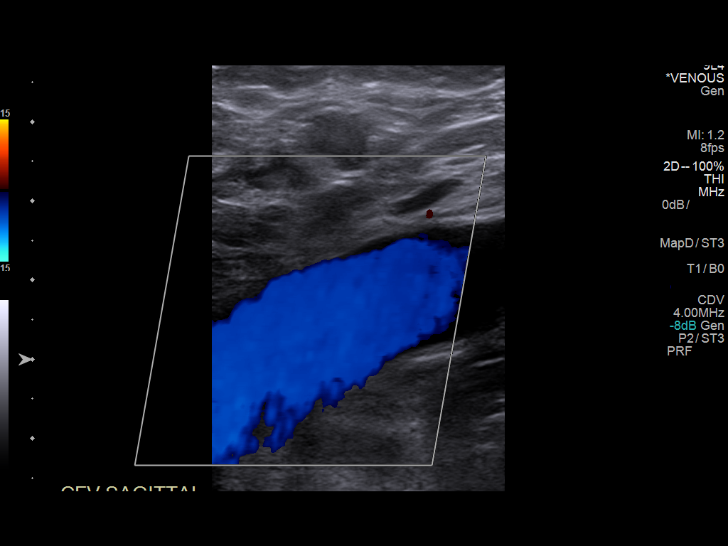
[im 10/36]
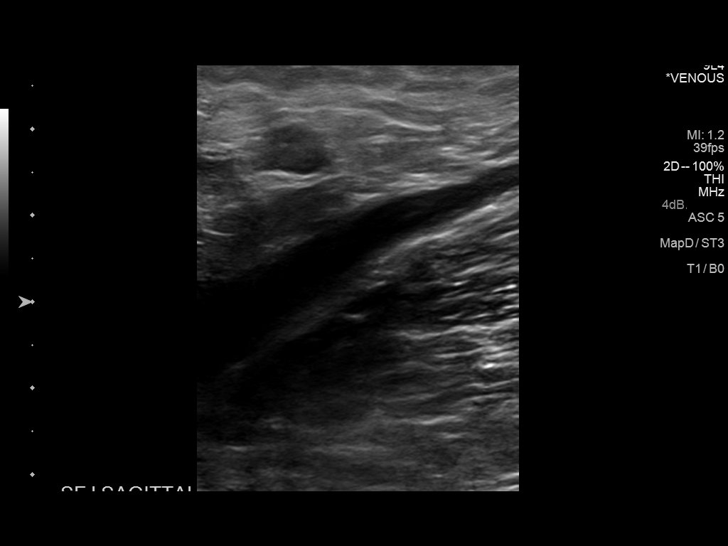
[im 13/36]
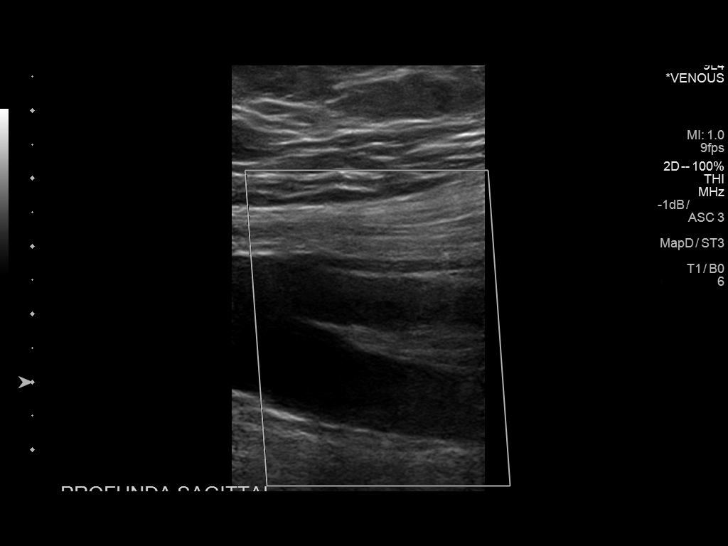
[im 16/36]
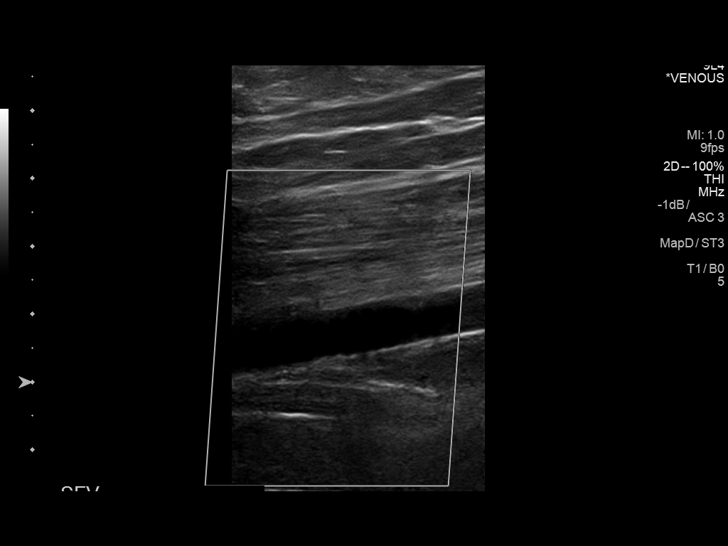
[im 19/36]
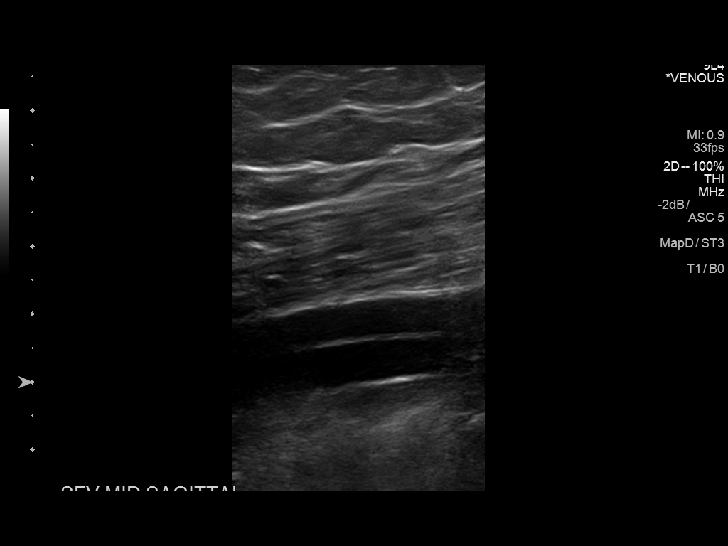
[im 20/36]
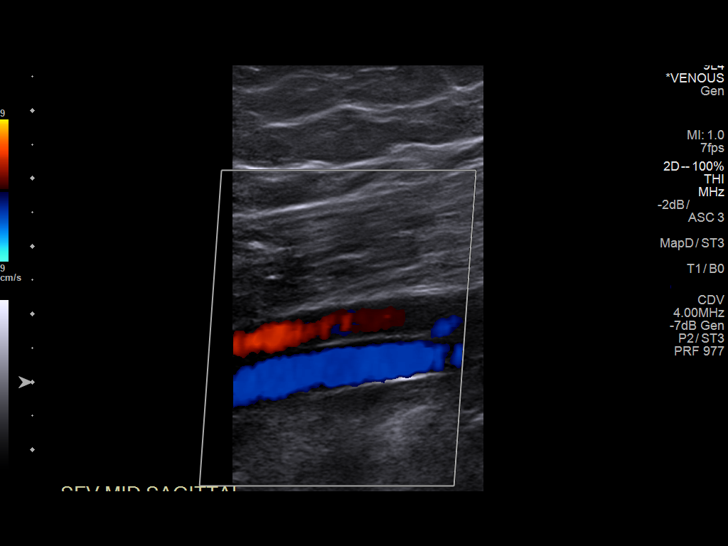
[im 23/36]
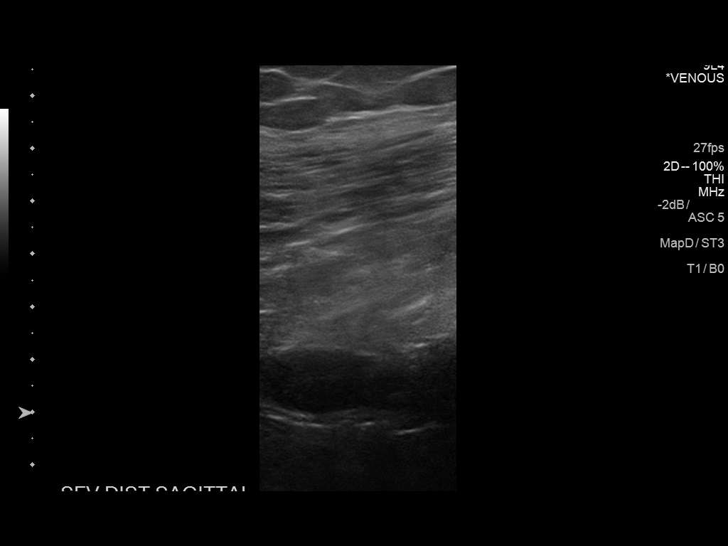
[im 26/36]
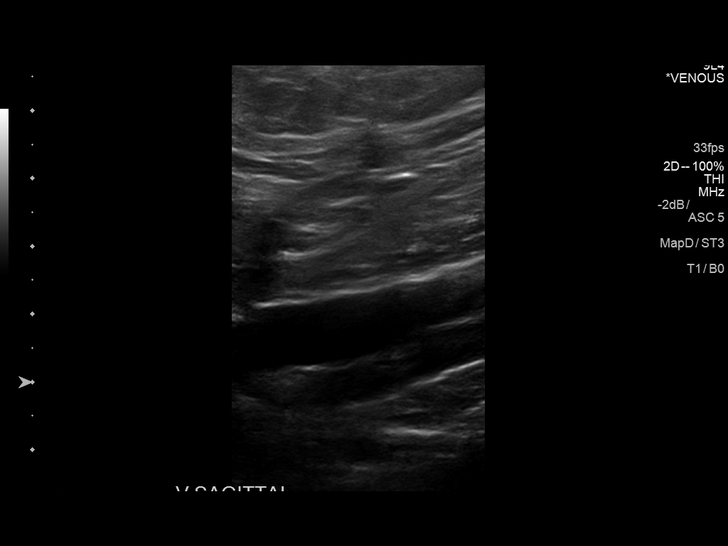
[im 29/36]
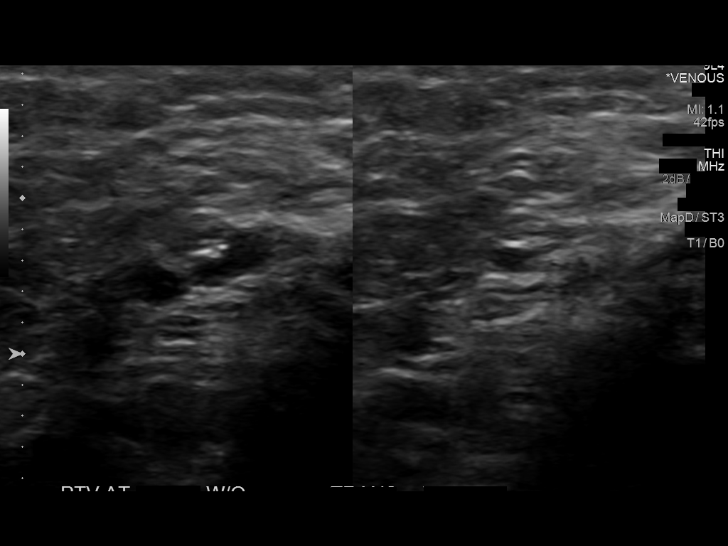
[im 32/36]
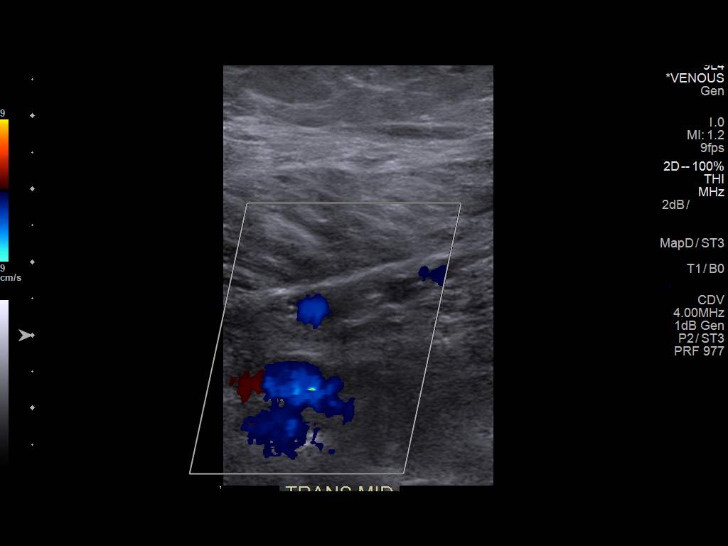
[im 36/36]
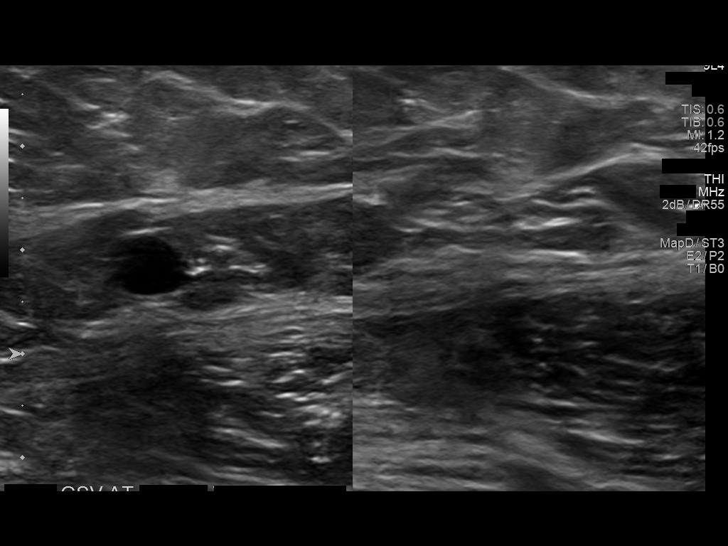

[13 of 24 positions shown; findings below may reference images not displayed]

FINDINGS: Contralateral Common Femoral Vein: Respiratory phasicity is normal
and symmetric with the symptomatic side. No evidence of thrombus.
Normal compressibility.

Common Femoral Vein: No evidence of thrombus. Normal
compressibility, respiratory phasicity and response to augmentation.

Saphenofemoral Junction: No evidence of thrombus. Normal
compressibility and flow on color Doppler imaging.

Profunda Femoral Vein: No evidence of thrombus. Normal
compressibility and flow on color Doppler imaging.

Femoral Vein: No evidence of thrombus. Normal compressibility,
respiratory phasicity and response to augmentation.

Popliteal Vein: No evidence of thrombus. Normal compressibility,
respiratory phasicity and response to augmentation.

Calf Veins: No evidence of thrombus. Normal compressibility and flow
on color Doppler imaging.

Superficial Great Saphenous Vein: No evidence of thrombus. Normal
compressibility and flow on color Doppler imaging.

Venous Reflux:  None.

Other Findings:  None.
IMPRESSION: No evidence of deep venous thrombosis.

## 2016-09-28 ENCOUNTER — Emergency Department (HOSPITAL_BASED_OUTPATIENT_CLINIC_OR_DEPARTMENT_OTHER)
Admission: EM | Admit: 2016-09-28 | Discharge: 2016-09-28 | Disposition: A | Discharge status: Home / Self Care | Payer: Self-pay | Attending: Emergency Medicine | Admitting: Emergency Medicine

## 2016-09-28 ENCOUNTER — Encounter (HOSPITAL_BASED_OUTPATIENT_CLINIC_OR_DEPARTMENT_OTHER): Payer: Self-pay | Admitting: Emergency Medicine

## 2016-09-28 DIAGNOSIS — F17200 Nicotine dependence, unspecified, uncomplicated: Secondary | ICD-10-CM | POA: Insufficient documentation

## 2016-09-28 DIAGNOSIS — J00 Acute nasopharyngitis [common cold]: Secondary | ICD-10-CM | POA: Insufficient documentation

## 2016-09-28 DIAGNOSIS — Z79899 Other long term (current) drug therapy: Secondary | ICD-10-CM | POA: Insufficient documentation

## 2016-09-28 DIAGNOSIS — J45909 Unspecified asthma, uncomplicated: Secondary | ICD-10-CM | POA: Insufficient documentation

## 2016-09-28 LAB — RAPID STREP SCREEN (MED CTR MEBANE ONLY): STREPTOCOCCUS, GROUP A SCREEN (DIRECT): NEGATIVE

## 2016-09-28 MED ORDER — CETIRIZINE-PSEUDOEPHEDRINE ER 5-120 MG PO TB12: 1.0000 | ORAL_TABLET | Freq: Every day | ORAL | 0 refills | Status: DC

## 2016-09-28 MED ORDER — FLUTICASONE PROPIONATE 50 MCG/ACT NA SUSP: 2.0000 | Freq: Every day | NASAL | 0 refills | Status: DC

## 2016-09-28 MED ORDER — BENZOCAINE-MENTHOL 6-10 MG MT LOZG: 1.0000 | LOZENGE | OROMUCOSAL | 0 refills | Status: DC | PRN

## 2016-09-28 MED ORDER — PROMETHAZINE-DM 6.25-15 MG/5ML PO SYRP: 5.0000 mL | ORAL_SOLUTION | Freq: Four times a day (QID) | ORAL | 0 refills | Status: DC | PRN

## 2016-09-28 NOTE — ED Notes (Signed)
Assessment done after PT was seen by PA Scnetxerena.

## 2016-09-28 NOTE — ED Provider Notes (Signed)
[facility]   CSN: [id number] Arrival date & time: 09/28/16  1543  By signing my name below, I, April Morrison, attest that this documentation has been prepared under the direction and in the presence of non-physician practitioner, Carlene CoriaSerena Y Adley Castello, PA-C. Electronically Signed: Majel HomerPeyton Morrison, Scribe. 09/28/2016. 4:59 PM.  History   Chief Complaint Chief Complaint  Patient presents with  . Sore Throat   The history is provided by the patient. No language interpreter was used.   HPI Comments: April Morrison is a 25 y.o. female who presents to the Emergency Department complaining of gradually worsening, sore throat that began 4 days ago and worsened today. Pt reports associated bilateral ear and eye pain, headache, difficulty swallowing, myalgias, cough and subjective fever. She also notes intermittent congestion and sinus drainage. Pt states she has taken Mucinex with no relief.   Past Medical History:  Diagnosis Date  . Asthma   . Cholecystitis   . No pertinent past medical history    Patient Active Problem List   Diagnosis Date Noted  . Acute cholecystitis 09/03/2012   Past Surgical History:  Procedure Laterality Date  . CHOLECYSTECTOMY  08/27/2012   Procedure: LAPAROSCOPIC CHOLECYSTECTOMY;  Surgeon: Romie LeveeAlicia Thomas, MD;  Location: [facility];  Service: General;;  attempted cholangiogram  . NO PAST SURGERIES     OB History    No data available     Home Medications    Prior to Admission medications   Medication Sig Start Date End Date Taking? Authorizing Provider  metroNIDAZOLE (FLAGYL) 500 MG tablet Take 1 tablet (500 mg total) by mouth 2 (two) times daily. One po bid x 7 days 07/25/15   Kathrynn Speedobyn M Hess, PA-C    Family History History reviewed. No pertinent family history.  Social History Social History  Substance Use Topics  . Smoking status: Current Every Day Smoker  . Smokeless tobacco: Not on file  . Alcohol use Yes     Allergies   Review of patient's  allergies indicates no known allergies.  Review of Systems Review of Systems 10 systems reviewed and all are negative for acute change except as noted in the HPI.  Physical Exam Updated Vital Signs BP 127/77   Pulse 88   Temp 98.3 F (36.8 C)   Resp 20   Ht 5\' 9"  (1.753 m)   Wt 264 lb (119.7 kg)   LMP 09/25/2016   SpO2 98%   BMI 38.99 kg/m   Physical Exam  Constitutional: She is oriented to person, place, and time. She appears well-developed and well-nourished.  HENT:  Head: Normocephalic.  Right Ear: External ear normal.  Left Ear: External ear normal.  Nose: Mucosal edema present.  Mouth/Throat: Oropharynx is clear and moist.  Bilateral ear effusion with no TM erythema, bulging, or retraction. Canals NL. No posterior oropharyngeal edema or erythema. Tonsils unremarkable with no exudate  Eyes: Conjunctivae and EOM are normal. Pupils are equal, round, and reactive to light.  Neck: Normal range of motion. Neck supple.  No cervical LAD  Cardiovascular: Normal rate, regular rhythm and normal heart sounds.   Pulmonary/Chest: Effort normal and breath sounds normal. No respiratory distress. She has no wheezes. She has no rales.  Abdominal: Soft. She exhibits no distension. There is no tenderness.  Musculoskeletal: Normal range of motion.  Neurological: She is alert and oriented to person, place, and time.  Skin: Skin is warm and dry.  Psychiatric: She has a normal mood and affect.  Nursing note  and vitals reviewed.  ED Treatments / Results  Labs (all labs ordered are listed, but only abnormal results are displayed) Labs Reviewed  RAPID STREP SCREEN (NOT AT [facility])  CULTURE, GROUP A STREP [facility])    EKG  EKG Interpretation None      Radiology No results found.  Procedures Procedures (including critical care time)  Medications Ordered in ED Medications - No data to display  DIAGNOSTIC STUDIES:  Oxygen Saturation is 98% on RA, normal by my interpretation.     COORDINATION OF CARE:  4:56 PM Discussed treatment plan with pt at bedside and pt agreed to plan.  Initial Impression / Assessment and Plan / ED Course  I have reviewed the triage vital signs and the nursing notes.  Pertinent labs & imaging results that were available during my care of the patient were reviewed by me and considered in my medical decision making (see chart for details).  Clinical Course    Suspect viral URI. Rapid strep obtained in triage is negative. Sent for GAS culture. Pt with four days of cough, sore throat, congestion, body aches. Will treat symptomatically. Lungs CTAB and I have a low suspicion for pneumonia, will defer CXR today. ER return precautions given.  I personally performed the services described in this documentation, which was scribed in my presence. The recorded information has been reviewed and is accurate.   Final Clinical Impressions(s) / ED Diagnoses   Final diagnoses:  Acute nasopharyngitis    New Prescriptions Discharge Medication List as of 09/28/2016  5:03 PM    START taking these medications   Details  benzocaine-menthol (CHLORAEPTIC) 6-10 MG lozenge Take 1 lozenge by mouth as needed for sore throat., Starting Sun 09/28/2016, Print    cetirizine-pseudoephedrine (ZYRTEC-D) 5-120 MG tablet Take 1 tablet by mouth daily., Starting Sun 09/28/2016, Print    fluticasone (FLONASE) 50 MCG/ACT nasal spray Place 2 sprays into both nostrils daily., Starting Sun 09/28/2016, Print    promethazine-dextromethorphan (PROMETHAZINE-DM) 6.25-15 MG/5ML syrup Take 5 mLs by mouth 4 (four) times daily as needed for cough., Starting Sun 09/28/2016, Print         Carlene Coria, PA-C 09/28/16 1840    Gwyneth Sprout, MD 09/28/16 2326

## 2016-09-28 NOTE — Discharge Instructions (Signed)
You likely have a virus. Take medication as prescribed as needed. Return to the ER for new or worsening symptoms.

## 2016-09-28 NOTE — ED Notes (Signed)
Pt stable, alert, DC instructions given to pt, RX x 4 given to pt, pt denies questions.

## 2016-09-28 NOTE — ED Notes (Signed)
Pt states she came into ED for sores on her lips for a couple of days.  Denies other symptoms.

## 2016-09-28 NOTE — ED Triage Notes (Signed)
Pt in c/o sore throat x 4 days. Pt alert, interactive, ambulatory in NAD.

## 2016-10-01 LAB — CULTURE, GROUP A STREP (THRC)

## 2017-01-08 ENCOUNTER — Encounter (HOSPITAL_BASED_OUTPATIENT_CLINIC_OR_DEPARTMENT_OTHER): Payer: Self-pay | Admitting: *Deleted

## 2017-01-08 ENCOUNTER — Emergency Department (HOSPITAL_BASED_OUTPATIENT_CLINIC_OR_DEPARTMENT_OTHER)
Admission: EM | Admit: 2017-01-08 | Discharge: 2017-01-08 | Disposition: A | Discharge status: Home / Self Care | Payer: Self-pay | Attending: Emergency Medicine | Admitting: Emergency Medicine

## 2017-01-08 DIAGNOSIS — F17290 Nicotine dependence, other tobacco product, uncomplicated: Secondary | ICD-10-CM | POA: Insufficient documentation

## 2017-01-08 DIAGNOSIS — M25551 Pain in right hip: Secondary | ICD-10-CM

## 2017-01-08 DIAGNOSIS — J45909 Unspecified asthma, uncomplicated: Secondary | ICD-10-CM | POA: Insufficient documentation

## 2017-01-08 DIAGNOSIS — M5431 Sciatica, right side: Secondary | ICD-10-CM

## 2017-01-08 DIAGNOSIS — M5441 Lumbago with sciatica, right side: Secondary | ICD-10-CM | POA: Insufficient documentation

## 2017-01-08 DIAGNOSIS — M62830 Muscle spasm of back: Secondary | ICD-10-CM | POA: Insufficient documentation

## 2017-01-08 LAB — URINALYSIS, ROUTINE W REFLEX MICROSCOPIC
BILIRUBIN URINE: NEGATIVE
Glucose, UA: NEGATIVE mg/dL
Ketones, ur: NEGATIVE mg/dL
Leukocytes, UA: NEGATIVE
NITRITE: NEGATIVE
Protein, ur: NEGATIVE mg/dL
SPECIFIC GRAVITY, URINE: 1.028 (ref 1.005–1.030)
pH: 6 (ref 5.0–8.0)

## 2017-01-08 LAB — URINALYSIS, MICROSCOPIC (REFLEX)

## 2017-01-08 MED ORDER — CYCLOBENZAPRINE HCL 10 MG PO TABS: 10.0000 mg | ORAL_TABLET | Freq: Three times a day (TID) | ORAL | 0 refills | Status: AC | PRN

## 2017-01-08 MED ORDER — PREDNISONE 20 MG PO TABS: ORAL_TABLET | ORAL | 0 refills | Status: AC

## 2017-01-08 MED ORDER — NAPROXEN 500 MG PO TABS: 500.0000 mg | ORAL_TABLET | Freq: Two times a day (BID) | ORAL | 0 refills | Status: AC | PRN

## 2017-01-08 NOTE — Discharge Instructions (Signed)
Back/hip Pain: Your back/hip pain should be treated with medicines such as ibuprofen or aleve and this back pain should get better over the next 2 weeks.  However if you develop severe or worsening pain, low back pain with fever, numbness, weakness or inability to walk or urinate, you should return to the ER immediately.  Please follow up with your doctor this week for a recheck if still having symptoms.  Avoid heavy lifting over 10 pounds over the next two weeks.  Low back pain is discomfort in the lower back that may be due to injuries to muscles and ligaments around the spine.  Occasionally, it may be caused by a a problem to a part of the spine called a disc.  Hip pain can also be caused by muscle spasms or cartilage/ligament injuries. The pain may last several days or a week;  However, most patients get completely well in 4 weeks.  Self - care:  The application of heat can help soothe the pain.  Maintaining your daily activities, including walking, is encourged, as it will help you get better faster than just staying in bed. Perform gentle stretching as discussed. Drink plenty of fluids.  Medications are also useful to help with pain control.  A commonly prescribed medication includes tylenol.  Non steroidal anti inflammatory medications including Ibuprofen and naproxen;  These medications help both pain and swelling and are very useful in treating back pain.  They should be taken with food, as they can cause stomach upset, and more seriously, stomach bleeding.    Muscle relaxants:  These medications can help with muscle tightness that is a cause of lower back pain.  Most of these medications can cause drowsiness, and it is not safe to drive or use dangerous machinery while taking them.  Prednisone: take as directed, with breakfast, and this should help with the burning pain you experienced earlier today.   SEEK IMMEDIATE MEDICAL ATTENTION IF: New numbness, tingling, weakness, or problem with  the use of your arms or legs.  Severe back pain not relieved with medications.  Difficulty with or loss of control of your bowel or bladder control.  Increasing pain in any areas of the body (such as chest or abdominal pain).  Shortness of breath, dizziness or fainting.  Nausea (feeling sick to your stomach), vomiting, fever, or sweats.  You will need to follow up with [place] [facility] in 1-2 weeks for reassessment and to establish medical care.

## 2017-01-08 NOTE — ED Triage Notes (Signed)
Pt c/o right hip pain which radiates down to knee  X 3 months

## 2017-01-08 NOTE — ED Provider Notes (Signed)
[facility] Provider Note   CSN: [id number] Arrival date & time: 01/08/17  2003   By signing my name below, I, Clarisse Gouge, attest that this documentation has been prepared under the direction and in the presence of [address] [place], [employer]. Electronically Signed: Clarisse Gouge, Scribe. 01/08/17. 10:00 PM.   History   Chief Complaint Chief Complaint  Patient presents with  . Hip Pain   The history is provided by the patient and medical records. No language interpreter was used.  Hip Pain  This is a new problem. The current episode started more than 1 week ago. The problem occurs constantly. The problem has been gradually worsening. Pertinent negatives include no chest pain, no abdominal pain and no shortness of breath. The symptoms are aggravated by bending, walking, twisting and standing. The symptoms are relieved by ice and NSAIDs. She has tried a cold compress for the symptoms. The treatment provided mild relief.    HPI Comments: April Morrison is a 26 y.o. female who presents to the Emergency Department complaining of back sided right hip pain x 2 months. States she works on her feet all day and thought it could just be from her job, but pain has gradually worsened. She describes the pain as 8/10 constant sharp/throbbing posterior R hip pain radiating to her R knee/lower leg, worse with sitting, standing, and specific movements, and mildly improved with ibuprofen use. Denies injuries or trauma/falls. States earlier she had some burning in her R knee but it has since resolved. Denies fever/chills, CP, SOB, abdominal pain, n/v/d/c, dysuria, hematuria, flank pain, numbness, tingling, weakness, saddle anesthesia/cauda equina symptoms, incontinence of urine/stool, or any other complaints at this time. No PCP currently.  Past Medical History:  Diagnosis Date  . Asthma   . Cholecystitis   . No pertinent past medical history     Patient Active Problem List   Diagnosis Date  Noted  . Acute cholecystitis 09/03/2012    Past Surgical History:  Procedure Laterality Date  . CHOLECYSTECTOMY  08/27/2012   Procedure: LAPAROSCOPIC CHOLECYSTECTOMY;  Surgeon: Romie Levee, MD;  Location: [facility];  Service: General;;  attempted cholangiogram  . NO PAST SURGERIES      OB History    No data available       Home Medications    Prior to Admission medications   Medication Sig Start Date End Date Taking? Authorizing Provider  cyclobenzaprine (FLEXERIL) 10 MG tablet Take 1 tablet (10 mg total) by mouth 3 (three) times daily as needed for muscle spasms. 01/08/17   Zaniyah Wernette Strupp Tayshaun Kroh, PA-C  naproxen (NAPROSYN) 500 MG tablet Take 1 tablet (500 mg total) by mouth 2 (two) times daily as needed for mild pain, moderate pain or headache (TAKE WITH MEALS.). 01/08/17   Azalyn Sliwa Strupp Nyima Vanacker, PA-C  predniSONE (DELTASONE) 20 MG tablet 3 tabs po daily x 4 days 01/08/17   [place] Bertran Zeimet, PA-C    Family History No family history on file.  Social History Social History  Substance Use Topics  . Smoking status: Current Every Day Smoker    Packs/day: 0.50  . Smokeless tobacco: Not on file  . Alcohol use Yes     Allergies   Patient has no known allergies.   Review of Systems Review of Systems  Constitutional: Negative for chills and fever.  Respiratory: Negative for shortness of breath.   Cardiovascular: Negative for chest pain.  Gastrointestinal: Negative for abdominal pain, constipation, diarrhea, nausea and vomiting.  Genitourinary: Negative for dysuria, flank  pain and hematuria.  Musculoskeletal: Positive for arthralgias (R hip) and back pain. Negative for myalgias.  Skin: Negative for color change.  Allergic/Immunologic: Negative for immunocompromised state.  Neurological: Negative for weakness and numbness.  Psychiatric/Behavioral: Negative for confusion.  10 Systems reviewed and are negative for acute change except as noted in the HPI.    Physical  Exam Updated Vital Signs BP 134/98 (BP Location: Right Arm)   Pulse 98   Temp 99.8 F (37.7 C) (Oral)   Resp 20   Ht 5\' 9"  (1.753 m)   Wt 122.5 kg   LMP 12/29/2016   SpO2 100%   BMI 39.87 kg/m    Physical Exam  Constitutional: She is oriented to person, place, and time. Vital signs are normal. She appears well-developed and well-nourished.  Non-toxic appearance. No distress.  Afebrile, nontoxic, NAD  HENT:  Head: Normocephalic and atraumatic.  Mouth/Throat: Mucous membranes are normal.  Eyes: Conjunctivae and EOM are normal. Right eye exhibits no discharge. Left eye exhibits no discharge.  Neck: Normal range of motion. Neck supple.  Cardiovascular: Normal rate and intact distal pulses.   Pulmonary/Chest: Effort normal. No respiratory distress.  Abdominal: Normal appearance. She exhibits no distension.  Musculoskeletal: Normal range of motion.       Right hip: She exhibits tenderness. She exhibits normal range of motion, normal strength, no bony tenderness, no crepitus and no deformity.  right hip with FROM intact, with no bony or joint line TTP, although with mild gluteal/ right lumbar paraspinous muscle TTP and spasm, without spinous process TTP, no bony stepoffs or deformities, no bruising or swelling, no crepitus or deformity, no limb length discrepancy or abnormal rotation. No pain with log roll testing. Negative SLR bilaterally, Strength and sensation grossly intact, distal pulses intact, compartments soft, gait steady and nonantalgic.   Neurological: She is alert and oriented to person, place, and time. She has normal strength. No sensory deficit.  Skin: Skin is warm, dry and intact. No rash noted.  Psychiatric: She has a normal mood and affect. Her behavior is normal.  Nursing note and vitals reviewed.    ED Treatments / Results  DIAGNOSTIC STUDIES: Oxygen Saturation is 100% on RA, normal by my interpretation.    COORDINATION OF CARE: 10:00 PM Discussed treatment plan  with pt at bedside and pt agreed to plan.  Labs (all labs ordered are listed, but only abnormal results are displayed) Labs Reviewed  URINALYSIS, ROUTINE W REFLEX MICROSCOPIC - Abnormal; Notable for the following:       Result Value   Hgb urine dipstick LARGE (*)    All other components within normal limits  URINALYSIS, MICROSCOPIC (REFLEX) - Abnormal; Notable for the following:    Bacteria, UA FEW (*)    Squamous Epithelial / LPF 0-5 (*)    All other components within normal limits    EKG  EKG Interpretation None       Radiology No results found.  Procedures Procedures (including critical care time)  Medications Ordered in ED Medications - No data to display   Initial Impression / Assessment and Plan / ED Course  I have reviewed the triage vital signs and the nursing notes.  Pertinent labs & imaging results that were available during my care of the patient were reviewed by me and considered in my medical decision making (see chart for details).     26 y.o. female here with R hip pain/gluteal pain x2 months. Had some burning pain in her R knee  earlier as well. No red flag s/s of low back pain, and no focal bony TTP in back or R hip. No s/s of central cord compression or cauda equina. Lower extremities are neurovascularly intact and patient is ambulating without difficulty. Neg SLR bilaterally but her symptoms sound somewhat like possibly sciatica. Doubt need for xray imaging at this time. U/A was sent before my eval, despite her not having any urinary complaints; appears contaminated but no UTI. Pain likely either muscle related or possibly hip labrum etiology? If pain persists after conservative tx, may warrant outpatient MRI. Will start on prednisone for possible sciatica.   Patient was counseled on back pain precautions and told to do activity as tolerated but do not lift, push, or pull heavy objects more than 10 pounds for the next week. Patient counseled to use ice or  heat on back for no longer than 15 minutes every hour.   Rx given for muscle relaxer and counseled on proper use of muscle relaxant medication. Rx given for NSAID as well. Urged patient not to drink alcohol, drive, or perform any other activities that requires focus while taking either of these medications. Discussed tylenol use.   Patient urged to follow-up with [facility] in 1wk to establish care and recheck on symptoms, and if pain does not improve with treatment and rest or if pain becomes recurrent. Urged to return with worsening severe pain, loss of bowel or bladder control, trouble walking. The patient verbalizes understanding and agrees with the plan.   I personally performed the services described in this documentation, which was scribed in my presence. The recorded information has been reviewed and is accurate.   Final Clinical Impressions(s) / ED Diagnoses   Final diagnoses:  Right hip pain  Sciatica of right side  Muscle spasm of back    New Prescriptions New Prescriptions   CYCLOBENZAPRINE (FLEXERIL) 10 MG TABLET    Take 1 tablet (10 mg total) by mouth 3 (three) times daily as needed for muscle spasms.   NAPROXEN (NAPROSYN) 500 MG TABLET    Take 1 tablet (500 mg total) by mouth 2 (two) times daily as needed for mild pain, moderate pain or headache (TAKE WITH MEALS.).   PREDNISONE (DELTASONE) 20 MG TABLET    3 tabs po daily x 4 days     [address], PA-C 01/08/17 2211    Lavera Guise, MD 01/09/17 1109

## 2017-01-12 ENCOUNTER — Ambulatory Visit: Payer: Self-pay | Attending: Internal Medicine | Admitting: Physician Assistant

## 2017-01-12 VITALS — BP 119/79 | HR 90 | Temp 98.3°F | Resp 18 | Ht 69.0 in | Wt 291.8 lb

## 2017-01-12 DIAGNOSIS — M25551 Pain in right hip: Secondary | ICD-10-CM | POA: Insufficient documentation

## 2017-01-12 DIAGNOSIS — R319 Hematuria, unspecified: Secondary | ICD-10-CM | POA: Insufficient documentation

## 2017-01-12 DIAGNOSIS — J45909 Unspecified asthma, uncomplicated: Secondary | ICD-10-CM | POA: Insufficient documentation

## 2017-01-12 DIAGNOSIS — M5431 Sciatica, right side: Secondary | ICD-10-CM | POA: Insufficient documentation

## 2017-01-12 DIAGNOSIS — Z9049 Acquired absence of other specified parts of digestive tract: Secondary | ICD-10-CM | POA: Insufficient documentation

## 2017-01-12 LAB — POCT URINALYSIS DIPSTICK
Bilirubin, UA: NEGATIVE
GLUCOSE UA: NEGATIVE
Ketones, UA: NEGATIVE
Leukocytes, UA: NEGATIVE
NITRITE UA: NEGATIVE
Protein, UA: NEGATIVE
Spec Grav, UA: >=1.03
UROBILINOGEN UA: 0.2
pH, UA: 5.5

## 2017-01-12 NOTE — Progress Notes (Signed)
Ptatient is here for Sciatic pain  Patient has not taken medication today. Patient has eaten today.  Patient declined the flu vaccine today.

## 2017-01-12 NOTE — Progress Notes (Signed)
April Morrison  ZOX:[id number]  [id number]  DOB - 1991-04-27  Chief Complaint  Patient presents with  . Sciatica       Subjective:   April Morrison is a 25 y.o. female here today for establishment of care. She has no significant past medical history other than a cholecystectomy. She presented to the emergency department on 01/08/2017 with right hip pain for months. She works at [employer] and is on her feet a lot. Her symptoms are worse with movement. She usually is a little better with ice and anti-inflammatories. It increased to an 8 on a scale of 1-10 and she went to the local emergency department. No imaging was done at that time. A urine dipstick did show a large amount of blood. She states she was not on her menstrual cycle. She was given a prescription for Naprosyn, Flexeril and Deltasone. She just got these medications filled on yesterday and is feeling some relief.   ROS: GEN: denies fever or chills, denies change in weight Skin: denies lesions or rashes HEENT: denies headache, earache, epistaxis, sore throat, or neck pain LUNGS: denies SHOB, dyspnea, PND, orthopnea CV: denies CP or palpitations ABD: denies abd pain, N or V EXT: denies muscle spasms or swelling; no pain in lower ext, no weakness NEURO: denies numbness or tingling, denies sz, stroke or TIA  ALLERGIES: No Known Allergies  PAST MEDICAL HISTORY: Past Medical History:  Diagnosis Date  . Asthma   . Cholecystitis   . No pertinent past medical history     PAST SURGICAL HISTORY: Past Surgical History:  Procedure Laterality Date  . CHOLECYSTECTOMY  08/27/2012   Procedure: LAPAROSCOPIC CHOLECYSTECTOMY;  Surgeon: Romie Levee, MD;  Location: [facility];  Service: General;;  attempted cholangiogram  . NO PAST SURGERIES      MEDICATIONS AT HOME: Prior to Admission medications   Medication Sig Start Date End Date Taking? Authorizing Provider  cyclobenzaprine (FLEXERIL) 10 MG tablet Take 1 tablet (10 mg total)  by mouth 3 (three) times daily as needed for muscle spasms. 01/08/17  Yes Mercedes Strupp Street, PA-C  naproxen (NAPROSYN) 500 MG tablet Take 1 tablet (500 mg total) by mouth 2 (two) times daily as needed for mild pain, moderate pain or headache (TAKE WITH MEALS.). 01/08/17  Yes Mercedes Strupp Street, PA-C  predniSONE (DELTASONE) 20 MG tablet 3 tabs po daily x 4 days 01/08/17  Yes Mercedes Strupp Street, PA-C     Objective:   Vitals:   01/12/17 1443  BP: 119/79  Pulse: 90  Resp: 18  Temp: 98.3 F (36.8 C)  TempSrc: Oral  SpO2: 99%  Weight: 291 lb 12.8 oz (132.4 kg)  Height: 5\' 9"  (1.753 m)    Exam-benign General appearance : Awake, alert, not in any distress. Speech Clear. Not toxic looking HEENT: Atraumatic and Normocephalic, pupils equally reactive to light and accomodation Neck: supple, no JVD. No cervical lymphadenopathy.  Chest:Good air entry bilaterally, no added sounds  CVS: S1 S2 regular, no murmurs.  Abdomen: Bowel sounds present, Non tender and not distended with no gaurding, rigidity or rebound. Extremities: B/L Lower Ext shows no edema, both legs are warm to touch Neurology: Awake alert, and oriented X 3, CN II-XII intact, Non focal Skin:No Rash Wounds:N/A  Dipstick-large blood  Assessment & Plan  1. Right hip pain/sciatica  -encouraged to comply with steroids/NSAIDS/muscle relaxer  -Avoid triggers  -2 week f/u  2. Hematuria  -Urology referral   3. Smoker  -cessation discussed. She is not ready  to QUIT.    Return in about 2 weeks (around 01/26/2017).  The patient was given clear instructions to go to ER or return to medical center if symptoms don't improve, worsen or new problems develop. The patient verbalized understanding. The patient was told to call to get lab results if they haven't heard anything in the next week.   This note has been created with Education officer, environmentalDragon speech recognition software and smart phrase technology. Any transcriptional errors are  unintentional.    Scot Juniffany Sumayya Muha, PA-C [facility] [place], [place] [phone]   01/12/2017, 2:58 PM

## 2017-01-29 ENCOUNTER — Ambulatory Visit: Payer: Self-pay

## 2017-01-29 ENCOUNTER — Ambulatory Visit: Payer: Self-pay | Admitting: Family Medicine

## 2018-02-16 ENCOUNTER — Emergency Department (HOSPITAL_BASED_OUTPATIENT_CLINIC_OR_DEPARTMENT_OTHER)
Admission: EM | Admit: 2018-02-16 | Discharge: 2018-02-16 | Disposition: A | Discharge status: Home / Self Care | Payer: Self-pay | Attending: Emergency Medicine | Admitting: Emergency Medicine

## 2018-02-16 ENCOUNTER — Encounter (HOSPITAL_BASED_OUTPATIENT_CLINIC_OR_DEPARTMENT_OTHER): Payer: Self-pay | Admitting: *Deleted

## 2018-02-16 ENCOUNTER — Other Ambulatory Visit: Payer: Self-pay

## 2018-02-16 DIAGNOSIS — R05 Cough: Secondary | ICD-10-CM | POA: Insufficient documentation

## 2018-02-16 DIAGNOSIS — Z79899 Other long term (current) drug therapy: Secondary | ICD-10-CM | POA: Insufficient documentation

## 2018-02-16 DIAGNOSIS — I10 Essential (primary) hypertension: Secondary | ICD-10-CM | POA: Insufficient documentation

## 2018-02-16 DIAGNOSIS — R059 Cough, unspecified: Secondary | ICD-10-CM

## 2018-02-16 DIAGNOSIS — J020 Streptococcal pharyngitis: Secondary | ICD-10-CM | POA: Insufficient documentation

## 2018-02-16 DIAGNOSIS — Z87891 Personal history of nicotine dependence: Secondary | ICD-10-CM | POA: Insufficient documentation

## 2018-02-16 HISTORY — DX: Essential (primary) hypertension: I10

## 2018-02-16 LAB — RAPID STREP SCREEN (MED CTR MEBANE ONLY): Streptococcus, Group A Screen (Direct): POSITIVE — AB

## 2018-02-16 MED ORDER — PENICILLIN G BENZATHINE 1200000 UNIT/2ML IM SUSP
INTRAMUSCULAR | Status: AC
  Filled 2018-02-16: qty 2

## 2018-02-16 MED ORDER — DEXAMETHASONE SODIUM PHOSPHATE 10 MG/ML IJ SOLN
INTRAMUSCULAR | Status: AC
  Filled 2018-02-16: qty 1

## 2018-02-16 MED ORDER — DEXAMETHASONE 10 MG/ML FOR PEDIATRIC ORAL USE
10.0000 mg | Freq: Once | INTRAMUSCULAR | Status: AC
  Administered 2018-02-16: 10 mg via ORAL

## 2018-02-16 MED ORDER — IBUPROFEN 400 MG PO TABS
400.0000 mg | ORAL_TABLET | Freq: Once | ORAL | Status: AC
  Administered 2018-02-16: 400 mg via ORAL

## 2018-02-16 MED ORDER — ACETAMINOPHEN 500 MG PO TABS
ORAL_TABLET | ORAL | Status: AC
  Filled 2018-02-16: qty 2

## 2018-02-16 MED ORDER — PENICILLIN G BENZATHINE 1200000 UNIT/2ML IM SUSP
1.2000 10*6.[IU] | Freq: Once | INTRAMUSCULAR | Status: AC
  Administered 2018-02-16: 1.2 10*6.[IU] via INTRAMUSCULAR

## 2018-02-16 MED ORDER — IBUPROFEN 400 MG PO TABS
ORAL_TABLET | ORAL | Status: AC
  Filled 2018-02-16: qty 1

## 2018-02-16 MED ORDER — IBUPROFEN 400 MG PO TABS
400.0000 mg | ORAL_TABLET | Freq: Once | ORAL | Status: AC
  Administered 2018-02-16: 400 mg via ORAL
  Filled 2018-02-16: qty 1

## 2018-02-16 MED ORDER — ACETAMINOPHEN 500 MG PO TABS
1000.0000 mg | ORAL_TABLET | Freq: Once | ORAL | Status: AC
  Administered 2018-02-16: 1000 mg via ORAL

## 2018-02-16 NOTE — ED Triage Notes (Signed)
Cough, headache, sore throat since yesterday 

## 2018-02-16 NOTE — ED Provider Notes (Signed)
[facility] EMERGENCY DEPARTMENT Provider Note   CSN: [id number] Arrival date & time: 02/16/18  1839     History   Chief Complaint Chief Complaint  Patient presents with  . Cough  . Sore Throat    HPI April Morrison is a 27 y.o. female.  27 year old female with past medical history including cholecystectomy, hypertension who presents with sore throat and cough.  Yesterday she began having sore throat associated with cough, headaches.  She was feeling bad today but was not aware that she was running a fever until she checked in at triage.  No medications prior to arrival.  Multiple sick contacts at work but no sick contacts at home.  No vomiting but she has had some diarrhea.  No breathing or swallowing problems.  She has been drinking orange juice.     The history is provided by the patient.  Cough   Sore Throat     Past Medical History:  Diagnosis Date  . Cholecystitis   . Hypertension   . No pertinent past medical history     Patient Active Problem List   Diagnosis Date Noted  . Acute cholecystitis 09/03/2012    Past Surgical History:  Procedure Laterality Date  . CHOLECYSTECTOMY  08/27/2012   Procedure: LAPAROSCOPIC CHOLECYSTECTOMY;  Surgeon: Romie LeveeAlicia Thomas, MD;  Location: [facility];  Service: General;;  attempted cholangiogram  . NO PAST SURGERIES      OB History    No data available       Home Medications    Prior to Admission medications   Medication Sig Start Date End Date Taking? Authorizing Provider  HYDROCHLOROTHIAZIDE PO Take by mouth.   Yes [provider]  cyclobenzaprine (FLEXERIL) 10 MG tablet Take 1 tablet (10 mg total) by mouth 3 (three) times daily as needed for muscle spasms. 01/08/17   Street, [place], PA-C  naproxen (NAPROSYN) 500 MG tablet Take 1 tablet (500 mg total) by mouth 2 (two) times daily as needed for mild pain, moderate pain or headache (TAKE WITH MEALS.). 01/08/17   Street, [place], PA-C  predniSONE (DELTASONE)  20 MG tablet 3 tabs po daily x 4 days 01/08/17   Street, [place], PA-C    Family History No family history on file.  Social History Social History   Tobacco Use  . Smoking status: Current Every Day Smoker    Packs/day: 0.50  . Smokeless tobacco: Current User  Substance Use Topics  . Alcohol use: Yes  . Drug use: No     Allergies   Patient has no known allergies.   Review of Systems Review of Systems  Respiratory: Positive for cough.    All other systems reviewed and are negative except that which was mentioned in HPI   Physical Exam Updated Vital Signs BP 119/78   Pulse (!) 120   Temp 100.3 F (37.9 C)   Resp (!) 24   Ht 5\' 9"  (1.753 m)   Wt 116.1 kg (256 lb)   LMP 01/30/2018   SpO2 99%   BMI 37.80 kg/m   Physical Exam  Constitutional: She is oriented to person, place, and time. She appears well-developed and well-nourished. No distress.  HENT:  Head: Normocephalic and atraumatic.  Mouth/Throat: Uvula is midline.  Moist mucous membranes Tonsils erythematous and mildly enlarged with white exudates, no tonsillar asymmetry  Eyes: Conjunctivae are normal. Pupils are equal, round, and reactive to light.  Neck: Neck supple.  Cardiovascular: Normal rate, regular rhythm and normal heart sounds.  No murmur heard. Pulmonary/Chest: Effort normal and breath sounds normal.  Abdominal: Soft. Bowel sounds are normal. She exhibits no distension. There is no tenderness.  Musculoskeletal: She exhibits no edema.  Neurological: She is alert and oriented to person, place, and time.  Fluent speech  Skin: Skin is warm and dry. No rash noted.  Psychiatric: She has a normal mood and affect. Judgment normal.  Nursing note and vitals reviewed.    ED Treatments / Results  Labs (all labs ordered are listed, but only abnormal results are displayed) Labs Reviewed  RAPID STREP SCREEN (NOT AT [facility]) - Abnormal; Notable for the following components:      Result Value    Streptococcus, Group A Screen (Direct) POSITIVE (*)    All other components within normal limits    EKG  EKG Interpretation None       Radiology No results found.  Procedures Procedures (including critical care time)  Medications Ordered in ED Medications  acetaminophen (TYLENOL) 500 MG tablet (not administered)  ibuprofen (ADVIL,MOTRIN) 400 MG tablet (not administered)  dexamethasone (DECADRON) 10 MG/ML injection (not administered)  penicillin g benzathine (BICILLIN LA) 1200000 UNIT/2ML injection (not administered)  ibuprofen (ADVIL,MOTRIN) tablet 400 mg (400 mg Oral Given 02/16/18 1852)  acetaminophen (TYLENOL) tablet 1,000 mg (1,000 mg Oral Given 02/16/18 2028)  ibuprofen (ADVIL,MOTRIN) tablet 400 mg (400 mg Oral Given 02/16/18 2028)  dexamethasone (DECADRON) 10 MG/ML injection for Pediatric ORAL use 10 mg (10 mg Oral Given 02/16/18 2120)  penicillin g benzathine (BICILLIN LA) 1200000 UNIT/2ML injection 1.2 Million Units (1.2 Million Units Intramuscular Given 02/16/18 2120)     Initial Impression / Assessment and Plan / ED Course  I have reviewed the triage vital signs and the nursing notes.  Pertinent labs  that were available during my care of the patient were reviewed by me and considered in my medical decision making (see chart for details).    Well appearing on exam. T 100.9 Shiley, improved after Tylenol and ibuprofen.  Rapid strep was positive.  Gave Bicillin and Decadron for swelling.  Discussed supportive measures.  Given associated cough fevers, and headache, I discussed the possibility of concurrent viral syndrome.  Reviewed return precautions including difficulty swallowing, breathing problems, or severe tonsil swelling.  Patient voiced understanding and discharged in satisfactory condition.  Final Clinical Impressions(s) / ED Diagnoses   Final diagnoses:  Strep pharyngitis  Cough    ED Discharge Orders    None       Brelan Hannen, Ambrose Finland, MD 02/16/18  2159

## 2018-02-16 NOTE — ED Notes (Signed)
ED Provider at bedside. 

## 2020-12-17 ENCOUNTER — Other Ambulatory Visit: Payer: Self-pay

## 2020-12-17 ENCOUNTER — Emergency Department (HOSPITAL_BASED_OUTPATIENT_CLINIC_OR_DEPARTMENT_OTHER)
Admission: EM | Admit: 2020-12-17 | Discharge: 2020-12-17 | Disposition: A | Discharge status: Home / Self Care | Payer: Medicaid Other | Attending: Emergency Medicine | Admitting: Emergency Medicine

## 2020-12-17 ENCOUNTER — Encounter (HOSPITAL_BASED_OUTPATIENT_CLINIC_OR_DEPARTMENT_OTHER): Payer: Self-pay | Admitting: *Deleted

## 2020-12-17 DIAGNOSIS — J069 Acute upper respiratory infection, unspecified: Secondary | ICD-10-CM

## 2020-12-17 DIAGNOSIS — Z79899 Other long term (current) drug therapy: Secondary | ICD-10-CM | POA: Insufficient documentation

## 2020-12-17 DIAGNOSIS — I10 Essential (primary) hypertension: Secondary | ICD-10-CM | POA: Insufficient documentation

## 2020-12-17 DIAGNOSIS — F17200 Nicotine dependence, unspecified, uncomplicated: Secondary | ICD-10-CM | POA: Insufficient documentation

## 2020-12-17 DIAGNOSIS — Z20822 Contact with and (suspected) exposure to covid-19: Secondary | ICD-10-CM | POA: Insufficient documentation

## 2020-12-17 NOTE — ED Triage Notes (Signed)
Cough and headache x 2 days. She had a recent Covid exposure.

## 2020-12-17 NOTE — Discharge Instructions (Signed)
Follow-up for your Covid test results in your MyChart.  Quarantine if positive.

## 2020-12-17 NOTE — ED Provider Notes (Signed)
[facility] EMERGENCY DEPARTMENT Provider Note   CSN: [id number] Arrival date & time: 12/17/20  1600     History Chief Complaint  Patient presents with  . Cough    April Morrison is a 29 y.o. female.  29yo female with complaint of headache, cough,  Symptoms started 12/15/20 Denies body aches, congestion, sneezing. Exposed to her kids who have been sick with similar symptoms. Patient is vaccinated against COVID 19, is a daily smoker, no history of asthma or chronic lung disease.         Past Medical History:  Diagnosis Date  . Cholecystitis   . Hypertension   . No pertinent past medical history     Patient Active Problem List   Diagnosis Date Noted  . Acute cholecystitis 09/03/2012    Past Surgical History:  Procedure Laterality Date  . CHOLECYSTECTOMY  08/27/2012   Procedure: LAPAROSCOPIC CHOLECYSTECTOMY;  Surgeon: Romie Levee, MD;  Location: [facility];  Service: General;;  attempted cholangiogram  . NO PAST SURGERIES       OB History   No obstetric history on file.     No family history on file.  Social History   Tobacco Use  . Smoking status: Current Every Day Smoker    Packs/day: 0.50  . Smokeless tobacco: Current User  Substance Use Topics  . Alcohol use: Yes  . Drug use: No    Home Medications Prior to Admission medications   Medication Sig Start Date End Date Taking? Authorizing Provider  HYDROCHLOROTHIAZIDE PO Take by mouth.   Yes [provider]  cyclobenzaprine (FLEXERIL) 10 MG tablet Take 1 tablet (10 mg total) by mouth 3 (three) times daily as needed for muscle spasms. 01/08/17   Street, [place], PA-C  naproxen (NAPROSYN) 500 MG tablet Take 1 tablet (500 mg total) by mouth 2 (two) times daily as needed for mild pain, moderate pain or headache (TAKE WITH MEALS.). 01/08/17   Street, [place], PA-C  predniSONE (DELTASONE) 20 MG tablet 3 tabs po daily x 4 days 01/08/17   Street, [place], [place]    Allergies    Patient has no  known allergies.  Review of Systems   Review of Systems  Constitutional: Negative for chills and fever.  HENT: Negative for congestion and sore throat.   Respiratory: Positive for cough.   Gastrointestinal: Negative for diarrhea and vomiting.  Musculoskeletal: Negative for arthralgias and myalgias.  Skin: Negative for rash.  Allergic/Immunologic: Negative for immunocompromised state.  Neurological: Positive for headaches.  Hematological: Negative for adenopathy.  All other systems reviewed and are negative.   Physical Exam Updated Vital Signs BP (!) 134/103   Pulse 88   Temp 98.3 F (36.8 C) (Oral)   Resp 20   Ht 5\' 9"  (1.753 m)   Wt 128.8 kg   LMP 11/25/2020   SpO2 99%   BMI 41.93 kg/m   Physical Exam Vitals and nursing note reviewed.  Constitutional:      General: She is not in acute distress.    Appearance: She is well-developed and well-nourished. She is not diaphoretic.  HENT:     Head: Normocephalic and atraumatic.     Right Ear: Tympanic membrane and ear canal normal.     Left Ear: Tympanic membrane and ear canal normal.     Nose: Nose normal. No congestion.     Mouth/Throat:     Mouth: Mucous membranes are moist.  Eyes:     Conjunctiva/sclera: Conjunctivae normal.  Cardiovascular:  Rate and Rhythm: Normal rate and regular rhythm.     Heart sounds: Normal heart sounds.  Pulmonary:     Effort: Pulmonary effort is normal.     Breath sounds: Normal breath sounds.  Skin:    General: Skin is warm and dry.     Findings: No erythema or rash.  Neurological:     Mental Status: She is alert and oriented to person, place, and time.  Psychiatric:        Mood and Affect: Mood and affect normal.        Behavior: Behavior normal.     ED Results / Procedures / Treatments   Labs (all labs ordered are listed, but only abnormal results are displayed) Labs Reviewed  SARS CORONAVIRUS 2 (TAT 6-24 HRS)    EKG None  Radiology No results  found.  Procedures Procedures (including critical care time)  Medications Ordered in ED Medications - No data to display  ED Course  I have reviewed the triage vital signs and the nursing notes.  Pertinent labs & imaging results that were available during my care of the patient were reviewed by me and considered in my medical decision making (see chart for details).  Clinical Course as of 12/17/20 2117  Mon Dec 17, 2020  [id number] 29 year old female with complaint of headache and cough x2 days, exposed to someone who has tested positive for Covid and requesting Covid testing.  Patient is well-appearing on exam.  Plan is to follow-up in her MyChart account for her Covid test results and quarantine as needed. [LM]    Clinical Course User Index [LM] Alden Hipp   MDM Rules/Calculators/A&P                          Final Clinical Impression(s) / ED Diagnoses Final diagnoses:  Viral URI with cough    Rx / DC Orders ED Discharge Orders    None       Alden Hipp 12/17/20 2117    Terald Sleeper, MD 12/18/20 [phone]

## 2020-12-18 LAB — SARS CORONAVIRUS 2 (TAT 6-24 HRS): SARS Coronavirus 2: NEGATIVE
